# Patient Record
Sex: Male | Born: 1985 | Race: Black or African American | Hispanic: No | Marital: Single | Smoking: Never smoker
Health system: Southern US, Community
[De-identification: ages and names within clinical notes are randomized; demographics above are authoritative.]

## PROBLEM LIST (undated history)

## (undated) DIAGNOSIS — J019 Acute sinusitis, unspecified: Secondary | ICD-10-CM

## (undated) DIAGNOSIS — R51 Headache: Secondary | ICD-10-CM

## (undated) DIAGNOSIS — D649 Anemia, unspecified: Secondary | ICD-10-CM

## (undated) DIAGNOSIS — J029 Acute pharyngitis, unspecified: Secondary | ICD-10-CM

## (undated) DIAGNOSIS — J189 Pneumonia, unspecified organism: Secondary | ICD-10-CM

## (undated) DIAGNOSIS — R011 Cardiac murmur, unspecified: Secondary | ICD-10-CM

## (undated) DIAGNOSIS — R21 Rash and other nonspecific skin eruption: Secondary | ICD-10-CM

## (undated) HISTORY — DX: Rash and other nonspecific skin eruption: R21

## (undated) HISTORY — DX: Headache: R51

## (undated) HISTORY — DX: Acute pharyngitis, unspecified: J02.9

## (undated) HISTORY — PX: WISDOM TOOTH EXTRACTION: SHX21

## (undated) HISTORY — DX: Pneumonia, unspecified organism: J18.9

## (undated) HISTORY — DX: Acute sinusitis, unspecified: J01.90

---
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
## 2011-05-25 ENCOUNTER — Emergency Department (HOSPITAL_COMMUNITY)
Admission: EM | Admit: 2011-05-25 | Discharge: 2011-05-26 | Disposition: A | Discharge status: Home / Self Care | Payer: 59 | Attending: Emergency Medicine | Admitting: Emergency Medicine

## 2011-05-25 DIAGNOSIS — R000 Tachycardia, unspecified: Secondary | ICD-10-CM | POA: Insufficient documentation

## 2011-05-25 DIAGNOSIS — J189 Pneumonia, unspecified organism: Secondary | ICD-10-CM | POA: Insufficient documentation

## 2011-05-25 DIAGNOSIS — R0682 Tachypnea, not elsewhere classified: Secondary | ICD-10-CM | POA: Insufficient documentation

## 2011-05-25 DIAGNOSIS — R059 Cough, unspecified: Secondary | ICD-10-CM | POA: Insufficient documentation

## 2011-05-25 DIAGNOSIS — R112 Nausea with vomiting, unspecified: Secondary | ICD-10-CM | POA: Insufficient documentation

## 2011-05-25 DIAGNOSIS — R509 Fever, unspecified: Secondary | ICD-10-CM | POA: Insufficient documentation

## 2011-05-25 DIAGNOSIS — R05 Cough: Secondary | ICD-10-CM | POA: Insufficient documentation

## 2011-05-26 ENCOUNTER — Emergency Department (HOSPITAL_COMMUNITY): Payer: 59

## 2011-05-26 LAB — CBC
HCT: 42.3 % (ref 39.0–52.0)
Hemoglobin: 14.6 g/dL (ref 13.0–17.0)
MCH: 30.2 pg (ref 26.0–34.0)
MCHC: 34.5 g/dL (ref 30.0–36.0)
MCV: 87.6 fL (ref 78.0–100.0)
Platelets: 132 10*3/uL — ABNORMAL LOW (ref 150–400)
RBC: 4.83 MIL/uL (ref 4.22–5.81)
RDW: 12.1 % (ref 11.5–15.5)
WBC: 5.9 10*3/uL (ref 4.0–10.5)

## 2011-05-26 LAB — POCT I-STAT, CHEM 8
BUN: 14 mg/dL (ref 6–23)
Calcium, Ion: 1.13 mmol/L (ref 1.12–1.32)
Chloride: 103 mEq/L (ref 96–112)
Creatinine, Ser: 1.2 mg/dL (ref 0.50–1.35)
Glucose, Bld: 89 mg/dL (ref 70–99)
HCT: 43 % (ref 39.0–52.0)
Hemoglobin: 14.6 g/dL (ref 13.0–17.0)
Potassium: 3.5 mEq/L (ref 3.5–5.1)
Sodium: 136 mEq/L (ref 135–145)
TCO2: 23 mmol/L (ref 0–100)

## 2011-05-26 LAB — DIFFERENTIAL
Basophils Absolute: 0 10*3/uL (ref 0.0–0.1)
Basophils Relative: 0 % (ref 0–1)
Eosinophils Absolute: 0 10*3/uL (ref 0.0–0.7)
Eosinophils Relative: 1 % (ref 0–5)
Lymphocytes Relative: 34 % (ref 12–46)
Lymphs Abs: 2 10*3/uL (ref 0.7–4.0)
Monocytes Absolute: 1.3 10*3/uL — ABNORMAL HIGH (ref 0.1–1.0)
Monocytes Relative: 21 % — ABNORMAL HIGH (ref 3–12)
Neutro Abs: 2.6 10*3/uL (ref 1.7–7.7)
Neutrophils Relative %: 44 % (ref 43–77)

## 2011-05-26 LAB — RAPID HIV SCREEN (WH-MAU): Rapid HIV Screen: NONREACTIVE

## 2011-05-28 ENCOUNTER — Emergency Department (HOSPITAL_COMMUNITY)
Admission: EM | Admit: 2011-05-28 | Discharge: 2011-05-28 | Disposition: A | Discharge status: Home / Self Care | Payer: 59 | Attending: Emergency Medicine | Admitting: Emergency Medicine

## 2011-05-28 ENCOUNTER — Emergency Department (HOSPITAL_COMMUNITY): Payer: 59

## 2011-05-28 DIAGNOSIS — J189 Pneumonia, unspecified organism: Secondary | ICD-10-CM | POA: Insufficient documentation

## 2011-05-28 DIAGNOSIS — R112 Nausea with vomiting, unspecified: Secondary | ICD-10-CM | POA: Insufficient documentation

## 2011-05-28 DIAGNOSIS — Z79899 Other long term (current) drug therapy: Secondary | ICD-10-CM | POA: Insufficient documentation

## 2011-05-28 LAB — BASIC METABOLIC PANEL
CO2: 27 mEq/L (ref 19–32)
Calcium: 8.9 mg/dL (ref 8.4–10.5)
Chloride: 101 mEq/L (ref 96–112)
Creatinine, Ser: 0.9 mg/dL (ref 0.50–1.35)
Glucose, Bld: 89 mg/dL (ref 70–99)
Sodium: 136 mEq/L (ref 135–145)

## 2011-05-28 LAB — DIFFERENTIAL
Lymphs Abs: 1.4 10*3/uL (ref 0.7–4.0)
Monocytes Relative: 22 % — ABNORMAL HIGH (ref 3–12)
Neutro Abs: 1.4 10*3/uL — ABNORMAL LOW (ref 1.7–7.7)
Neutrophils Relative %: 37 % — ABNORMAL LOW (ref 43–77)

## 2011-05-28 LAB — CBC
Hemoglobin: 13.5 g/dL (ref 13.0–17.0)
MCH: 29.5 pg (ref 26.0–34.0)
MCV: 86 fL (ref 78.0–100.0)
Platelets: 163 10*3/uL (ref 150–400)
RBC: 4.58 MIL/uL (ref 4.22–5.81)
WBC: 3.8 10*3/uL — ABNORMAL LOW (ref 4.0–10.5)

## 2014-05-02 ENCOUNTER — Emergency Department (HOSPITAL_COMMUNITY)
Admission: EM | Admit: 2014-05-02 | Discharge: 2014-05-02 | Disposition: A | Discharge status: Home / Self Care | Payer: No Typology Code available for payment source | Attending: Emergency Medicine | Admitting: Emergency Medicine

## 2014-05-02 ENCOUNTER — Emergency Department (HOSPITAL_COMMUNITY): Payer: No Typology Code available for payment source

## 2014-05-02 ENCOUNTER — Encounter (HOSPITAL_COMMUNITY): Payer: Self-pay | Admitting: Emergency Medicine

## 2014-05-02 DIAGNOSIS — W268XXA Contact with other sharp object(s), not elsewhere classified, initial encounter: Secondary | ICD-10-CM | POA: Diagnosis not present

## 2014-05-02 DIAGNOSIS — Z8709 Personal history of other diseases of the respiratory system: Secondary | ICD-10-CM | POA: Insufficient documentation

## 2014-05-02 DIAGNOSIS — IMO0002 Reserved for concepts with insufficient information to code with codable children: Secondary | ICD-10-CM | POA: Insufficient documentation

## 2014-05-02 DIAGNOSIS — S61219A Laceration without foreign body of unspecified finger without damage to nail, initial encounter: Secondary | ICD-10-CM

## 2014-05-02 DIAGNOSIS — Z880 Allergy status to penicillin: Secondary | ICD-10-CM | POA: Diagnosis not present

## 2014-05-02 DIAGNOSIS — Y9289 Other specified places as the place of occurrence of the external cause: Secondary | ICD-10-CM | POA: Insufficient documentation

## 2014-05-02 DIAGNOSIS — Y9389 Activity, other specified: Secondary | ICD-10-CM | POA: Diagnosis not present

## 2014-05-02 DIAGNOSIS — S61209A Unspecified open wound of unspecified finger without damage to nail, initial encounter: Secondary | ICD-10-CM | POA: Insufficient documentation

## 2014-05-02 DIAGNOSIS — Z862 Personal history of diseases of the blood and blood-forming organs and certain disorders involving the immune mechanism: Secondary | ICD-10-CM | POA: Diagnosis not present

## 2014-05-02 DIAGNOSIS — S6990XA Unspecified injury of unspecified wrist, hand and finger(s), initial encounter: Secondary | ICD-10-CM | POA: Diagnosis present

## 2014-05-02 DIAGNOSIS — M79641 Pain in right hand: Secondary | ICD-10-CM

## 2014-05-02 DIAGNOSIS — Z8701 Personal history of pneumonia (recurrent): Secondary | ICD-10-CM | POA: Insufficient documentation

## 2014-05-02 DIAGNOSIS — Z791 Long term (current) use of non-steroidal anti-inflammatories (NSAID): Secondary | ICD-10-CM | POA: Insufficient documentation

## 2014-05-02 DIAGNOSIS — R011 Cardiac murmur, unspecified: Secondary | ICD-10-CM | POA: Insufficient documentation

## 2014-05-02 HISTORY — DX: Anemia, unspecified: D64.9

## 2014-05-02 HISTORY — DX: Cardiac murmur, unspecified: R01.1

## 2014-05-02 MED ORDER — LIDOCAINE HCL (PF) 1 % IJ SOLN: 30.0000 mL | Freq: Once | INTRAMUSCULAR | Status: DC

## 2014-05-02 MED ORDER — LIDOCAINE HCL 2 % IJ SOLN
10.0000 mL | Freq: Once | INTRAMUSCULAR | Status: AC
  Administered 2014-05-02: 200 mg
  Filled 2014-05-02: qty 20

## 2014-05-02 MED ORDER — BACITRACIN ZINC 500 UNIT/GM EX OINT
1.0000 "application " | TOPICAL_OINTMENT | Freq: Two times a day (BID) | CUTANEOUS | Status: DC
  Administered 2014-05-02: 1 via TOPICAL
  Filled 2014-05-02: qty 0.9

## 2014-05-02 MED ORDER — BACITRACIN ZINC 500 UNIT/GM EX OINT: 1.0000 "application " | TOPICAL_OINTMENT | Freq: Two times a day (BID) | CUTANEOUS | Status: DC

## 2014-05-02 MED ORDER — IBUPROFEN 600 MG PO TABS
600.0000 mg | ORAL_TABLET | Freq: Four times a day (QID) | ORAL | Status: DC | PRN
Start: 2014-05-02 — End: 2015-09-04

## 2014-05-02 NOTE — Discharge Instructions (Signed)
Apply bacitracin twice a day to prevent scarring. Take ibuprofen for pain control. Return for suture removal in 12-14 days. Return sooner if signs of infection, such as pus drainage, redness with extreme heat to touch, or fever develop.  Laceration Care, Adult A laceration is a cut or lesion that goes through all layers of the skin and into the tissue just beneath the skin. TREATMENT  Some lacerations may not require closure. Some lacerations may not be able to be closed due to an increased risk of infection. It is important to see your caregiver as soon as possible after an injury to minimize the risk of infection and maximize the opportunity for successful closure. If closure is appropriate, pain medicines may be given, if needed. The wound will be cleaned to help prevent infection. Your caregiver will use stitches (sutures), staples, wound glue (adhesive), or skin adhesive strips to repair the laceration. These tools bring the skin edges together to allow for faster healing and a better cosmetic outcome. However, all wounds will heal with a scar. Once the wound has healed, scarring can be minimized by covering the wound with sunscreen during the day for 1 full year. HOME CARE INSTRUCTIONS  For sutures or staples:  Keep the wound clean and dry.  If you were given a bandage (dressing), you should change it at least once a day. Also, change the dressing if it becomes wet or dirty, or as directed by your caregiver.  Wash the wound with soap and water 2 times a day. Rinse the wound off with water to remove all soap. Pat the wound dry with a clean towel.  After cleaning, apply a thin layer of the antibiotic ointment as recommended by your caregiver. This will help prevent infection and keep the dressing from sticking.  You may shower as usual after the first 24 hours. Do not soak the wound in water until the sutures are removed.  Only take over-the-counter or prescription medicines for pain,  discomfort, or fever as directed by your caregiver.  Get your sutures or staples removed as directed by your caregiver. For skin adhesive strips:  Keep the wound clean and dry.  Do not get the skin adhesive strips wet. You may bathe carefully, using caution to keep the wound dry.  If the wound gets wet, pat it dry with a clean towel.  Skin adhesive strips will fall off on their own. You may trim the strips as the wound heals. Do not remove skin adhesive strips that are still stuck to the wound. They will fall off in time. For wound adhesive:  You may briefly wet your wound in the shower or bath. Do not soak or scrub the wound. Do not swim. Avoid periods of heavy perspiration until the skin adhesive has fallen off on its own. After showering or bathing, gently pat the wound dry with a clean towel.  Do not apply liquid medicine, cream medicine, or ointment medicine to your wound while the skin adhesive is in place. This may loosen the film before your wound is healed.  If a dressing is placed over the wound, be careful not to apply tape directly over the skin adhesive. This may cause the adhesive to be pulled off before the wound is healed.  Avoid prolonged exposure to sunlight or tanning lamps while the skin adhesive is in place. Exposure to ultraviolet light in the first year will darken the scar.  The skin adhesive will usually remain in place for 5 to  10 days, then naturally fall off the skin. Do not pick at the adhesive film. You may need a tetanus shot if:  You cannot remember when you had your last tetanus shot.  You have never had a tetanus shot. If you get a tetanus shot, your arm may swell, get red, and feel warm to the touch. This is common and not a problem. If you need a tetanus shot and you choose not to have one, there is a rare chance of getting tetanus. Sickness from tetanus can be serious. SEEK MEDICAL CARE IF:   You have redness, swelling, or increasing pain in the  wound.  You see a red line that goes away from the wound.  You have yellowish-white fluid (pus) coming from the wound.  You have a fever.  You notice a bad smell coming from the wound or dressing.  Your wound breaks open before or after sutures have been removed.  You notice something coming out of the wound such as wood or glass.  Your wound is on your hand or foot and you cannot move a finger or toe. SEEK IMMEDIATE MEDICAL CARE IF:   Your pain is not controlled with prescribed medicine.  You have severe swelling around the wound causing pain and numbness or a change in color in your arm, hand, leg, or foot.  Your wound splits open and starts bleeding.  You have worsening numbness, weakness, or loss of function of any joint around or beyond the wound.  You develop painful lumps near the wound or on the skin anywhere on your body. MAKE SURE YOU:   Understand these instructions.  Will watch your condition.  Will get help right away if you are not doing well or get worse. Document Released: 07/24/2005 Document Revised: 10/16/2011 Document Reviewed: 01/17/2011 [facility] Patient Information 2015 [place], [place]. This information is not intended to replace advice given to you by your health care provider. Make sure you discuss any questions you have with your health care provider.

## 2014-05-02 NOTE — ED Notes (Signed)
Pt states he punched apartment window @ 30 min ago, laceration to R hand, bandage noted from first responders.

## 2014-05-02 NOTE — ED Provider Notes (Signed)
Medical screening examination/treatment/procedure(s) were performed by non-physician practitioner and as supervising physician I was immediately available for consultation/collaboration.   EKG Interpretation None       Olivia Mackie, MD 05/02/14 [phone]

## 2014-05-02 NOTE — ED Notes (Signed)
Registration at bedside.

## 2014-05-02 NOTE — ED Notes (Signed)
Patient states he punched a glass window because he was mad. Patient denies intent to harm self. Patient reports tetanus was within prior 5 years. Patient states [employer] responded and initially wrapped his right hand. Dressing removed. Scant bleeding noted. Multiple laceration sites to right hand. Patient has 2 visitors at bedside at this time.

## 2014-05-02 NOTE — ED Provider Notes (Signed)
CSN: [id number]     Arrival date & time 05/02/14  0319 History   First MD Initiated Contact with Patient 05/02/14 [phone]     Chief Complaint  Patient presents with  . Laceration    (Consider location/radiation/quality/duration/timing/severity/associated sxs/prior Treatment) HPI Comments: 28 year old male presents to the emergency department for further evaluation of right hand injury after patient punched a window out of anger. Patient is right-hand dominant. Tetanus up-to-date. No medications given prior to arrival.  Patient is a 28 y.o. male presenting with skin laceration. The history is provided by the patient. No language interpreter was used.  Laceration Location:  Hand and finger Hand laceration location:  Dorsum of R hand Finger laceration location:  R index finger Length (cm):  2.5cm laceration to R index finger; small superficial lacerations also scattered on dorsum of R hand Depth:  Through dermis (R index) Quality: jagged   Bleeding: controlled   Time since incident: incident @ 0300. Laceration mechanism:  Broken glass (patient punched window) Pain details:    Quality:  Aching and throbbing   Severity:  Mild   Timing:  Constant   Progression:  Unchanged Foreign body present:  No foreign bodies Relieved by:  None tried Worsened by:  Pressure Tetanus status:  Up to date   Past Medical History  Diagnosis Date  . Acute pharyngitis   . Headache(784.0)   . Acute sinusitis, unspecified   . Rash and other nonspecific skin eruption   . Pneumonia   . Murmur   . Anemia    Past Surgical History  Procedure Laterality Date  . Wisdom tooth extraction     No family history on file. History  Substance Use Topics  . Smoking status: Never Smoker   . Smokeless tobacco: Not on file  . Alcohol Use: Yes     Comment: social    Review of Systems  Musculoskeletal: Positive for arthralgias.  Skin: Positive for wound.  Neurological: Negative for weakness and numbness.  All  other systems reviewed and are negative.   Allergies  Penicillins  Home Medications   Prior to Admission medications   Medication Sig Start Date End Date Taking? Authorizing Provider  ibuprofen (ADVIL,MOTRIN) 200 MG tablet Take 400 mg by mouth once.   Yes Historical Provider, MD   BP 115/64  Pulse 103  Temp(Src) 98.4 F (36.9 C) (Oral)  Resp 18  SpO2 99%  Physical Exam  Nursing note and vitals reviewed. Constitutional: He is oriented to person, place, and time. He appears well-developed and well-nourished. No distress.  Nontoxic/nonseptic appearing  HENT:  Head: Normocephalic and atraumatic.  Eyes: Conjunctivae and EOM are normal. No scleral icterus.  Neck: Normal range of motion.  Cardiovascular: Normal rate, regular rhythm and intact distal pulses.   Distal radial pulse 2+ in right upper extremity. Capillary refill normal in all digits of right hand  Pulmonary/Chest: Effort normal. No respiratory distress.  Chest expansion symmetric  Musculoskeletal: Normal range of motion.       Right hand: He exhibits tenderness and laceration. He exhibits normal range of motion, no bony tenderness, normal capillary refill, no deformity and no swelling. Normal sensation noted. Normal strength noted.       Hands: Neurological: He is alert and oriented to person, place, and time. He exhibits normal muscle tone. Coordination normal.  No gross sensory deficits appreciated. Patient able to wiggle all fingers of right hand.  Skin: Skin is warm and dry. No rash noted. He is not diaphoretic. No erythema.  No pallor.  Psychiatric: He has a normal mood and affect. His behavior is normal.    ED Course  Procedures (including critical care time) Labs Review Labs Reviewed - No data to display  Imaging Review Dg Hand Complete Right  05/02/2014   CLINICAL DATA:  Pain in the hand and lacerations over the knuckles after punching a window.  EXAM: RIGHT HAND - COMPLETE 3+ VIEW  COMPARISON:  None.   FINDINGS: There is no evidence of fracture or dislocation. There is no evidence of arthropathy or other focal bone abnormality. Soft tissues are unremarkable.  IMPRESSION: Negative.   Electronically Signed   By: Burman Nieves M.D.   On: 05/02/2014 04:19     EKG Interpretation None      LACERATION REPAIR Performed by: Antony Madura Authorized by: Antony Madura Consent: Verbal consent obtained. Risks and benefits: risks, benefits and alternatives were discussed Consent given by: patient Patient identity confirmed: provided demographic data Prepped and Draped in normal sterile fashion Wound explored  Laceration Location: R index finger  Laceration Length: 2.5cm  No Foreign Bodies seen or palpated  Anesthesia: local infiltration  Local anesthetic: lidocaine 2% without epinephrine  Anesthetic total: 1 ml  Irrigation method: syringe Amount of cleaning: standard  Skin closure: 5-0 ethilon  Number of sutures: 6  Technique: simple interrupted  Patient tolerance: Patient tolerated the procedure well with no immediate complications.  MDM   Final diagnoses:  Right hand pain  Finger laceration, initial encounter    28 year old male presents to the emergency department for further evaluation of right hand pain after he punched a window out of anger. Patient with lacerations, superficially, to dorsum of right hand as well as one 2.5 cm laceration to right index finger requiring suturing. Patient neurovascularly intact in physical exam. He exhibits normal range of motion. Imaging today shows no evidence of residual glass fragments. None visualize when exploring the wound.   Tdap booster UTD. Laceration occurred < 8 hours prior to repair which was well tolerated. Pt has no comorbidities to effect normal wound healing. Discussed suture home care with patient and answered questions. Pt to follow up for wound check and suture removal in 12-14 days. Patient is hemodynamically stable  with no complaints prior to discharge. Return precautions provided and patient agreeable to plan with no unaddressed concerns.   Filed Vitals:   05/02/14 0324  BP: 115/64  Pulse: 103  Temp: 98.4 F (36.9 C)  TempSrc: Oral  Resp: 18  SpO2: 99%          Antony Madura, PA-C 05/02/14 [phone]

## 2014-05-17 ENCOUNTER — Encounter (HOSPITAL_COMMUNITY): Payer: Self-pay | Admitting: Emergency Medicine

## 2014-05-17 ENCOUNTER — Emergency Department (HOSPITAL_COMMUNITY)
Admission: EM | Admit: 2014-05-17 | Discharge: 2014-05-17 | Disposition: A | Discharge status: Home / Self Care | Payer: No Typology Code available for payment source | Attending: Emergency Medicine | Admitting: Emergency Medicine

## 2014-05-17 DIAGNOSIS — Z8709 Personal history of other diseases of the respiratory system: Secondary | ICD-10-CM | POA: Diagnosis not present

## 2014-05-17 DIAGNOSIS — R011 Cardiac murmur, unspecified: Secondary | ICD-10-CM | POA: Diagnosis not present

## 2014-05-17 DIAGNOSIS — Z8701 Personal history of pneumonia (recurrent): Secondary | ICD-10-CM | POA: Diagnosis not present

## 2014-05-17 DIAGNOSIS — Z862 Personal history of diseases of the blood and blood-forming organs and certain disorders involving the immune mechanism: Secondary | ICD-10-CM | POA: Diagnosis not present

## 2014-05-17 DIAGNOSIS — Z4802 Encounter for removal of sutures: Secondary | ICD-10-CM | POA: Insufficient documentation

## 2014-05-17 DIAGNOSIS — Z792 Long term (current) use of antibiotics: Secondary | ICD-10-CM | POA: Diagnosis not present

## 2014-05-17 DIAGNOSIS — Z791 Long term (current) use of non-steroidal anti-inflammatories (NSAID): Secondary | ICD-10-CM | POA: Insufficient documentation

## 2014-05-17 DIAGNOSIS — Z880 Allergy status to penicillin: Secondary | ICD-10-CM | POA: Insufficient documentation

## 2014-05-17 NOTE — ED Provider Notes (Signed)
CSN: [id number]     Arrival date & time 05/17/14  [id number] History   First MD Initiated Contact with Patient 05/17/14 [phone]     Chief Complaint  Patient presents with  . Suture / Staple Removal     (Consider location/radiation/quality/duration/timing/severity/associated sxs/prior Treatment) HPI Jordan Everett is a 28 year old male presenting to the ER today for suture removal of 6 sutures. Patient was seen and evaluated on 05/02/14 for laceration of his finger. Patient underwent laceration repair of right index finger at that time was 6 sutures. Patient denies any swelling, erythema, drainage, loss of range of motion, weakness, numbness to the affected area. Patient denies fever, nausea, vomiting, shortness of breath. Past Medical History  Diagnosis Date  . Acute pharyngitis   . Headache(784.0)   . Acute sinusitis, unspecified   . Rash and other nonspecific skin eruption   . Pneumonia   . Murmur   . Anemia    Past Surgical History  Procedure Laterality Date  . Wisdom tooth extraction     History reviewed. No pertinent family history. History  Substance Use Topics  . Smoking status: Never Smoker   . Smokeless tobacco: Not on file  . Alcohol Use: Yes     Comment: social    Review of Systems  Skin: Positive for wound. Negative for color change.  Neurological: Negative for weakness and numbness.      Allergies  Penicillins  Home Medications   Prior to Admission medications   Medication Sig Start Date End Date Taking? Authorizing Provider  bacitracin ointment Apply 1 application topically 2 (two) times daily. 05/02/14  Yes Antony MaduraKelly Humes, PA-C  ibuprofen (ADVIL,MOTRIN) 200 MG tablet Take 400 mg by mouth once.   Yes Historical Provider, MD  ibuprofen (ADVIL,MOTRIN) 600 MG tablet Take 1 tablet (600 mg total) by mouth every 6 (six) hours as needed. 05/02/14  Yes Kelly Humes, PA-C   BP 115/69  Pulse 64  Temp(Src) 97.8 F (36.6 C) (Oral)  Resp 16  SpO2 100% Physical Exam  Nursing  note and vitals reviewed. Constitutional: He appears well-developed and well-nourished. No distress.  HENT:  Head: Normocephalic and atraumatic.  Eyes: Conjunctivae are normal. Right eye exhibits no discharge. Left eye exhibits no discharge. No scleral icterus.  Cardiovascular:  Peripheral pulses intact at injured extremity.   Pulmonary/Chest: Effort normal. No respiratory distress.  Neurological: He is alert.  No numbness distal to injury.    Skin: Skin is warm and dry. No rash noted. He is not diaphoretic.  Well-healed nonerythematous wound noted to right index finger. 6 [address]. the sutures noted to patient's wound. No erythema, edema, discharge,  signs of dehiscence or infection.     ED Course  Procedures (including critical care time) Labs Review Labs Reviewed - No data to display  Imaging Review No results found.   EKG Interpretation None      MDM   Final diagnoses:  Visit for suture removal    Staple removal   Pt to ER for staple/suture removal and wound check as above. Procedure tolerated well. Vitals normal, no signs of infection. Scar minimization & return precautions given at dc.   BP 115/69  Pulse 64  Temp(Src) 97.8 F (36.6 C) (Oral)  Resp 16  SpO2 100%  Signed,  Jordan MowJoe Ninetta Adelstein, PA-C 11:00 AM      Jordan FantasiaJoseph W Jamillah Camilo, PA-C 05/17/14 1100

## 2014-05-17 NOTE — ED Provider Notes (Signed)
Medical screening examination/treatment/procedure(s) were performed by non-physician practitioner and as supervising physician I was immediately available for consultation/collaboration.   EKG Interpretation None       Tarah Buboltz R. Dayne Chait, MD 05/17/14 1535 

## 2014-05-17 NOTE — Discharge Instructions (Signed)

## 2014-05-17 NOTE — ED Notes (Signed)
Here for suture removal from rt index finger.

## 2015-08-17 ENCOUNTER — Ambulatory Visit
Admission: RE | Admit: 2015-08-17 | Discharge: 2015-08-17 | Disposition: A | Discharge status: Home / Self Care | Payer: No Typology Code available for payment source | Source: Ambulatory Visit | Attending: Family Medicine | Admitting: Family Medicine

## 2015-08-17 ENCOUNTER — Other Ambulatory Visit: Payer: Self-pay | Admitting: Family Medicine

## 2015-08-17 DIAGNOSIS — M542 Cervicalgia: Secondary | ICD-10-CM

## 2015-09-04 ENCOUNTER — Encounter (HOSPITAL_COMMUNITY): Payer: Self-pay

## 2015-09-04 ENCOUNTER — Emergency Department (HOSPITAL_COMMUNITY): Payer: No Typology Code available for payment source

## 2015-09-04 ENCOUNTER — Emergency Department (HOSPITAL_COMMUNITY)
Admission: EM | Admit: 2015-09-04 | Discharge: 2015-09-04 | Disposition: A | Discharge status: Home / Self Care | Payer: No Typology Code available for payment source | Attending: Emergency Medicine | Admitting: Emergency Medicine

## 2015-09-04 DIAGNOSIS — F32A Depression, unspecified: Secondary | ICD-10-CM

## 2015-09-04 DIAGNOSIS — F329 Major depressive disorder, single episode, unspecified: Secondary | ICD-10-CM | POA: Insufficient documentation

## 2015-09-04 DIAGNOSIS — Z008 Encounter for other general examination: Secondary | ICD-10-CM | POA: Insufficient documentation

## 2015-09-04 DIAGNOSIS — Z915 Personal history of self-harm: Secondary | ICD-10-CM | POA: Insufficient documentation

## 2015-09-04 DIAGNOSIS — Z79899 Other long term (current) drug therapy: Secondary | ICD-10-CM | POA: Insufficient documentation

## 2015-09-04 DIAGNOSIS — Z8709 Personal history of other diseases of the respiratory system: Secondary | ICD-10-CM | POA: Insufficient documentation

## 2015-09-04 DIAGNOSIS — Z8701 Personal history of pneumonia (recurrent): Secondary | ICD-10-CM | POA: Insufficient documentation

## 2015-09-04 DIAGNOSIS — Z880 Allergy status to penicillin: Secondary | ICD-10-CM | POA: Insufficient documentation

## 2015-09-04 DIAGNOSIS — R011 Cardiac murmur, unspecified: Secondary | ICD-10-CM | POA: Insufficient documentation

## 2015-09-04 DIAGNOSIS — R079 Chest pain, unspecified: Secondary | ICD-10-CM | POA: Insufficient documentation

## 2015-09-04 DIAGNOSIS — Z862 Personal history of diseases of the blood and blood-forming organs and certain disorders involving the immune mechanism: Secondary | ICD-10-CM | POA: Insufficient documentation

## 2015-09-04 DIAGNOSIS — R45851 Suicidal ideations: Secondary | ICD-10-CM

## 2015-09-04 LAB — CBC WITH DIFFERENTIAL/PLATELET
Basophils Absolute: 0 10*3/uL (ref 0.0–0.1)
Basophils Relative: 0 %
Eosinophils Absolute: 0 10*3/uL (ref 0.0–0.7)
Eosinophils Relative: 0 %
HCT: 40.1 % (ref 39.0–52.0)
HEMOGLOBIN: 13.2 g/dL (ref 13.0–17.0)
LYMPHS ABS: 2.4 10*3/uL (ref 0.7–4.0)
LYMPHS PCT: 53 %
MCH: 29.9 pg (ref 26.0–34.0)
MCHC: 32.9 g/dL (ref 30.0–36.0)
MCV: 90.7 fL (ref 78.0–100.0)
MONO ABS: 0.6 10*3/uL (ref 0.1–1.0)
MONOS PCT: 13 %
NEUTROS ABS: 1.5 10*3/uL — AB (ref 1.7–7.7)
Neutrophils Relative %: 34 %
PLATELETS: 180 10*3/uL (ref 150–400)
RBC: 4.42 MIL/uL (ref 4.22–5.81)
RDW: 12.5 % (ref 11.5–15.5)
WBC: 4.5 10*3/uL (ref 4.0–10.5)

## 2015-09-04 LAB — BASIC METABOLIC PANEL
Anion gap: 7 (ref 5–15)
BUN: 13 mg/dL (ref 6–20)
CHLORIDE: 106 mmol/L (ref 101–111)
CO2: 25 mmol/L (ref 22–32)
Calcium: 8.8 mg/dL — ABNORMAL LOW (ref 8.9–10.3)
Creatinine, Ser: 0.9 mg/dL (ref 0.61–1.24)
Glucose, Bld: 103 mg/dL — ABNORMAL HIGH (ref 65–99)
POTASSIUM: 3.8 mmol/L (ref 3.5–5.1)
SODIUM: 138 mmol/L (ref 135–145)

## 2015-09-04 LAB — ETHANOL

## 2015-09-04 LAB — SALICYLATE LEVEL

## 2015-09-04 LAB — ACETAMINOPHEN LEVEL: Acetaminophen (Tylenol), Serum: <10 ug/mL — ABNORMAL LOW (ref 10–30)

## 2015-09-04 LAB — I-STAT TROPONIN, ED: TROPONIN I, POC: 0.01 ng/mL (ref 0.00–0.08)

## 2015-09-04 MED ORDER — LORAZEPAM 1 MG PO TABS: 1.0000 mg | ORAL_TABLET | Freq: Three times a day (TID) | ORAL | Status: DC | PRN

## 2015-09-04 MED ORDER — ACETAMINOPHEN 325 MG PO TABS
650.0000 mg | ORAL_TABLET | Freq: Once | ORAL | Status: AC
  Administered 2015-09-04: 650 mg via ORAL
  Filled 2015-09-04: qty 2

## 2015-09-04 MED ORDER — ACETAMINOPHEN 325 MG PO TABS: 650.0000 mg | ORAL_TABLET | ORAL | Status: DC | PRN

## 2015-09-04 MED ORDER — IBUPROFEN 200 MG PO TABS: 600.0000 mg | ORAL_TABLET | Freq: Three times a day (TID) | ORAL | Status: DC | PRN

## 2015-09-04 NOTE — ED Notes (Signed)
Per EMS- Patient texted his girlfriend today and stated that he had cut both of his arms and blood was everywhere. Patient did not cut himself. Patient states he has had depression for a couple of years. Patient also c/o CP and states this occurs when he is stressed. Patient has had no counseling. EKG OK. Patient is withdrawn and quiet.

## 2015-09-04 NOTE — ED Notes (Signed)
Patient states SI, with plan to cut himself. Patient states he hears voices that tell him that he is not worthy of anything. Patient denies HI and visual hallucinations.

## 2015-09-04 NOTE — BH Assessment (Addendum)
Assessment Note  Jordan Everett is a 30 year old male presenting with suicidal thoughts after he spoke to his ex-girlfriend on the telephone today.   Patient reports that, "they broke up" 2-3 weeks ago".  Patient reports that they were together for two years before they broke up.  Patient now denies any suicidal plan to harm himself.  Patient reports that he only said that so that his, "ex-girlfriend would feel sorry for him and then agree to come back to him".   Patient reports increased feelings of anxiety because, "she's already moved on and found another guy". Patient reports a history of self-injury behavior to include punching walls or slam his head into walls when he is upset.  Patient reports that he is not doing these things in an attempt to kill himself he is engaging in these self-injurious behaviors due to overwhelming feelings of anger and frustration and he does not know how to hand these emotions.   Patient denies hearing voices that tell him that he is not worthy of anything.  Patient denies HI and visual hallucinations.  Patient denies substance abuse.  Patient BAL is <5.  Patient now denies occasionally hearing commands voices telling him to hurt other people.  Patient reports that his feelings are hurt; however, "I would hurt his ex-girlfriend's or her new boyfriend".   Patient reports that he resides with his mother and father.  Patient reports that his parents are very supportive.  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  Patient reports that he has been working there for over a month.    Patient denies prior psychiatric hospitalizations.  Patient denies prior mental health outpatient medication management or therapy.  Patient denies physical, sexual or emotional abuse. Patient denies any upcoming court dates or having access to any weapons.    Per Leonette Most, NP - patient does not meet criteria for inpatient hospitalization.  Writer obtained  collateral information from the patient parent who reports that the patient was not trying to harm himself.  Patient does not have a psychiatric history.  The family reports that they feel safe with the patient returning home.  Writer gave referral information for the patient to follow up with [facility] intensive outpatient program. Writer agreed to go to therapy for his feeling of depression related to the break up from his girlfriend.   Writer informed the ER MD of the disposition.    Diagnosis: Major Depressive Disorder   Past Medical History:  Past Medical History  Diagnosis Date  . Acute pharyngitis   . Headache(784.0)   . Acute sinusitis, unspecified   . Rash and other nonspecific skin eruption   . Pneumonia   . Murmur   . Anemia     Past Surgical History  Procedure Laterality Date  . Wisdom tooth extraction      Family History: History reviewed. No pertinent family history.  Social History:  reports that he has never smoked. He has never used smokeless tobacco. He reports that he drinks alcohol. He reports that he does not use illicit drugs.  Additional Social History:  Alcohol / Drug Use History of alcohol / drug use?: No history of alcohol / drug abuse  CIWA: CIWA-Ar BP: 109/79 mmHg Pulse Rate: 77 COWS:    Allergies:  Allergies  Allergen Reactions  . Penicillins Hives    Has patient had a PCN reaction causing immediate rash, facial/tongue/throat swelling, SOB or lightheadedness with hypotension: No Has patient  had a PCN reaction causing severe rash involving mucus membranes or skin necrosis: Yes Has patient had a PCN reaction that required hospitalization No Has patient had a PCN reaction occurring within the last 10 years: No If all of the above answers are "NO", then may proceed with Cephalosporin use.     Home Medications:  (Not in a hospital admission)  OB/GYN Status:  No LMP for male patient.  General Assessment Data Location of Assessment: [facility]  ED TTS Assessment: In system Is this a Tele or Face-to-Face Assessment?: Face-to-Face Is this an Initial Assessment or a Re-assessment for this encounter?: Initial Assessment Marital status: Single Maiden name: NA Is patient pregnant?: No Pregnancy Status: No Living Arrangements: Parent Delice Bison and Terek Bee [phone] and [phone]) Can pt return to current living arrangement?: Yes Admission Status: Voluntary Is patient capable of signing voluntary admission?: Yes Referral Source: Self/Family/Friend Insurance type: None Reported  Medical Screening Exam [facility] Walk-in ONLY) Medical Exam completed: Yes  Crisis Care Plan Living Arrangements: Parent Delice Bison and Aleksi Brummet [phone] and [phone]) Legal Guardian:  (NA) Name of Psychiatrist: None Reported Name of Therapist: None Reported  Education Status Is patient currently in school?: No Current Grade: NA Highest grade of school patient has completed: 12 Name of school: NA Contact person: NA  Risk to self with the past 6 months Suicidal Ideation: Yes-Currently Present Has patient been a risk to self within the past 6 months prior to admission? : No Suicidal Intent: Yes-Currently Present Has patient had any suicidal intent within the past 6 months prior to admission? : No Is patient at risk for suicide?: No Suicidal Plan?: Yes-Currently Present Has patient had any suicidal plan within the past 6 months prior to admission? : No Specify Current Suicidal Plan: Told his ex-girlfriend that the would cut his wrist  (Patient now denies wanting to harm himself. ) Access to Means: Yes Specify Access to Suicidal Means: Knife What has been your use of drugs/alcohol within the last 12 months?: None Reported Previous Attempts/Gestures: No How many times?: 0 Other Self Harm Risks: None Reported Triggers for Past Attempts: None known Intentional Self Injurious Behavior: Bruising (Punching walls when he is angry ) Comment -  Self Injurious Behavior: Punching walls when he is aNGRY Family Suicide History: No Recent stressful life event(s): Conflict (Comment) (Broke up with his girlfriend) Persecutory voices/beliefs?: No Depression: Yes Depression Symptoms: Insomnia, Tearfulness, Fatigue, Loss of interest in usual pleasures, Feeling worthless/self pity, Feeling angry/irritable Substance abuse history and/or treatment for substance abuse?: No Suicide prevention information given to non-admitted patients: Yes  Risk to Others within the past 6 months Homicidal Ideation: No Does patient have any lifetime risk of violence toward others beyond the six months prior to admission? : No Thoughts of Harm to Others: No Current Homicidal Intent: No Current Homicidal Plan: No Access to Homicidal Means: No Identified Victim: None Reported History of harm to others?: No Assessment of Violence: None Noted Violent Behavior Description: None Reported Does patient have access to weapons?: No Criminal Charges Pending?: No Does patient have a court date: No Is patient on probation?: No  Psychosis Hallucinations: None noted Delusions: None noted  Mental Status Report Appearance/Hygiene: Disheveled Eye Contact: Poor Motor Activity: Freedom of movement, Restlessness Speech: Logical/coherent Level of Consciousness: Alert Mood: Depressed Affect: Depressed, Blunted Anxiety Level: None Thought Processes: Coherent, Relevant Judgement: Unimpaired Orientation: Place, Person, Time, Situation Obsessive Compulsive Thoughts/Behaviors: None  Cognitive Functioning Concentration: Decreased Memory: Recent Intact, Remote Intact IQ: Average Insight: Fair  Impulse Control: Poor Appetite: Fair Weight Loss: 0 Weight Gain: 0 Sleep: Decreased Total Hours of Sleep: 6 Vegetative Symptoms: Decreased grooming, Staying in bed  ADLScreening [facility]) Patient's cognitive ability adequate to safely complete daily  activities?: Yes Patient able to express need for assistance with ADLs?: Yes Independently performs ADLs?: Yes (appropriate for developmental age)  Prior Inpatient Therapy Prior Inpatient Therapy: No Prior Therapy Dates: NA Prior Therapy Facilty/Provider(s): NA Reason for Treatment: NA  Prior Outpatient Therapy Prior Outpatient Therapy: No Prior Therapy Dates: NA Prior Therapy Facilty/Provider(s): NA Reason for Treatment: NA Does patient have an ACCT team?: No Does patient have Intensive In-House Services?  : No Does patient have [facility] services? : No Does patient have P4CC services?: No  ADL Screening (condition at time of admission) Patient's cognitive ability adequate to safely complete daily activities?: Yes Is the patient deaf or have difficulty hearing?: No Does the patient have difficulty seeing, even when wearing glasses/contacts?: No Does the patient have difficulty concentrating, remembering, or making decisions?: No Patient able to express need for assistance with ADLs?: Yes Does the patient have difficulty dressing or bathing?: No Independently performs ADLs?: Yes (appropriate for developmental age) Does the patient have difficulty walking or climbing stairs?: No Weakness of Legs: None Weakness of Arms/Hands: None  Home Assistive Devices/Equipment Home Assistive Devices/Equipment: None    Abuse/Neglect Assessment (Assessment to be complete while patient is alone) Physical Abuse: Denies Verbal Abuse: Denies Sexual Abuse: Denies Exploitation of patient/patient's resources: Denies Self-Neglect: Denies Values / Beliefs Cultural Requests During Hospitalization: None Spiritual Requests During Hospitalization: None Consults Spiritual Care Consult Needed: No Social Work Consult Needed: No Merchant navy officer (For Healthcare) Does patient have an advance directive?: No Would patient like information on creating an advanced directive?: No - patient declined  information    Additional Information 1:1 In Past 12 Months?: No CIRT Risk: No Elopement Risk: No Does patient have medical clearance?: Yes     Disposition: Per Leonette Most, NP - patient does not meet criteria for inpatient hospitalization.  Writer obtained collateral information from the patient parent who reports that the patient was not trying to harm himself.  Patient does not have a psychiatric history.  The family reports that they feel safe with the patient returning home.  Writer gave referral information for the patient to follow up with [facility] intensive outpatient program. Writer agreed to go to therapy for his feeling of depression related to the break up from his girlfriend.   Writer informed the ER MD of the disposition.  Disposition Initial Assessment Completed for this Encounter: Yes Disposition of Patient: Other dispositions  On Site Evaluation by:   Reviewed with Physician:    Phillip Heal LaVerne 09/04/2015 7:55 PM

## 2015-09-04 NOTE — ED Provider Notes (Signed)
CSN: [id number]     Arrival date & time 09/04/15  1709 History   First MD Initiated Contact with Patient 09/04/15 1711     Chief Complaint  Patient presents with  . Medical Clearance  . Suicidal   HPI  [name] is a 30 year old male presenting with suicidal thoughts. He reports multiple years of feeling depressed but this has recently worsened. He and his girlfriend have recently broke up and he states "she's already moved on and found another guy". He states that he is now having suicidal thoughts and has a plan to slit his wrists. He had texted his ex-girlfriend earlier today that he had slit both his wrists when in fact he had not. He states that he is thinking about ending his life currently and feels that he may cut his wrists if he was at home. He reports a history of self injury. He states that he will punch walls or slam his head into walls when he is upset. He has not done this in the past month. He also notes hearing voices. He states this has been present for years as well. The voices are intermittent and show up when he is under stress. He states they tell him that he is "not good for anything" and "we knew you would mess it up". He states they occasionally command him to hurt other people. He states currently he has thoughts of wanting to hurt his ex-girlfriend's new boyfriend. He denies homicidal ideation. He has never followed with a counselor or psychiatrist for his depression. He is also complaining of left sided chest pain. He states that he always gets this chest pain when he is under stress. The pain has been present for approximately 1 hour. The pain is over the left anterior chest and described as throbbing. He states that stress exacerbates the pain and "feeling calm" alleviates it. No associated SOB. The pain is not exertional. He states it has begun to improve since he arrived at the ED. He has no other physical complaints. Denies auditory hallucinations.   Past Medical History   Diagnosis Date  . Acute pharyngitis   . Headache(784.0)   . Acute sinusitis, unspecified   . Rash and other nonspecific skin eruption   . Pneumonia   . Murmur   . Anemia    Past Surgical History  Procedure Laterality Date  . Wisdom tooth extraction     History reviewed. No pertinent family history. Social History  Substance Use Topics  . Smoking status: Never Smoker   . Smokeless tobacco: Never Used  . Alcohol Use: Yes     Comment: social    Review of Systems  Cardiovascular: Positive for chest pain.  Psychiatric/Behavioral: Positive for suicidal ideas, hallucinations, self-injury and dysphoric mood.  All other systems reviewed and are negative.     Allergies  Penicillins  Home Medications   Prior to Admission medications   Medication Sig Start Date End Date Taking? Authorizing Provider  aspirin-acetaminophen-caffeine (EXCEDRIN MIGRAINE) [phone] MG tablet Take 2 tablets by mouth every 6 (six) hours as needed for headache.   Yes Historical Provider, MD  cyclobenzaprine (FLEXERIL) 10 MG tablet Take 10 mg by mouth at bedtime as needed. 08/16/15  Yes Historical Provider, MD  ibuprofen (ADVIL,MOTRIN) 200 MG tablet Take 600-800 mg by mouth daily as needed for moderate pain.    Yes Historical Provider, MD  Multiple Vitamin (MULTIVITAMIN WITH MINERALS) TABS tablet Take 1 tablet by mouth daily.   Yes  Historical Provider, MD   BP 112/79 mmHg  Pulse 62  Temp(Src) 99 F (37.2 C) (Oral)  Resp 19  SpO2 99% Physical Exam  Constitutional: He appears well-developed and well-nourished. No distress.  HENT:  Head: Normocephalic and atraumatic.  Eyes: Conjunctivae are normal. Right eye exhibits no discharge. Left eye exhibits no discharge. No scleral icterus.  Neck: Normal range of motion.  Cardiovascular: Normal rate and regular rhythm.   Pulmonary/Chest: Effort normal and breath sounds normal. No respiratory distress. He has no wheezes. He has no rales.  Abdominal: Soft. There  is no tenderness.  Musculoskeletal: Normal range of motion.  Neurological: He is alert. Coordination normal.  Skin: Skin is warm and dry.  No wounds to the wrists  Psychiatric: His speech is delayed. He is withdrawn. Thought content is not paranoid and not delusional. He expresses impulsivity. He exhibits a depressed mood. He expresses suicidal ideation. He expresses no homicidal ideation. He expresses suicidal plans. He expresses no homicidal plans.  Speaking with a soft voice. Speech somewhat delayed. He is extremely withdrawn with a flat affect.   Nursing note and vitals reviewed.   ED Course  Procedures (including critical care time) Labs Review Labs Reviewed  CBC WITH DIFFERENTIAL/PLATELET - Abnormal; Notable for the following:    Neutro Abs 1.5 (*)    All other components within normal limits  BASIC METABOLIC PANEL - Abnormal; Notable for the following:    Glucose, Bld 103 (*)    Calcium 8.8 (*)    All other components within normal limits  ACETAMINOPHEN LEVEL - Abnormal; Notable for the following:    Acetaminophen (Tylenol), Serum <10 (*)    All other components within normal limits  SALICYLATE LEVEL  ETHANOL  URINALYSIS, ROUTINE W REFLEX MICROSCOPIC (NOT AT [facility])  URINE RAPID DRUG SCREEN, HOSP PERFORMED  I-STAT TROPOININ, ED    Imaging Review Dg Chest 2 View  09/04/2015  CLINICAL DATA:  30 year old with acute onset chest pain earlier today. EXAM: CHEST  2 VIEW COMPARISON:  05/28/2011, 05/26/2011. FINDINGS: Cardiomediastinal silhouette unremarkable. Lungs clear. Bronchovascular markings normal. Pulmonary vascularity normal. No pneumothorax. No pleural effusions. Visualized bony thorax intact. No sequela from the prior cavitary left upper lobe pneumonia identified on the prior examinations. IMPRESSION: Normal examination. Electronically Signed   By: Hulan Saas M.D.   On: 09/04/2015 18:55   I have personally reviewed and evaluated these images and lab results as part of  my medical decision-making.   EKG Interpretation   Date/Time:  Saturday September 04 2015 17:51:01 EST Ventricular Rate:  93 PR Interval:  189 QRS Duration: 77 QT Interval:  344 QTC Calculation: 428 R Axis:   67 Text Interpretation:  Sinus rhythm ST elev, probable normal early repol  pattern No significant change since last tracing Confirmed by Denton Lank  MD,  Caryn Bee ([id number]) on 09/04/2015 5:59:38 PM      MDM   Final diagnoses:  Depression  Suicidal ideations   Patient presenting with depression, SI and auditory hallucinations. Pt also complaining of chest pain. He reports history of chest pain when he is under stress. Chest pain is atypical in presentation. Unlikely to be cardiac or pulmonary. Denies all other complaints at this time. VSS and pt is non-toxic appearing. Benign physical exam and blood work is unremarkable. Troponin negative with non-ischemic EKG. Negative chest xray. Patient has been medically cleared in the ED and is appropriate for TTS consultation and their recommendations. Pt is in NAD and stable for holding in [facility].  Rolm Gala Aneta Hendershott, PA-C 09/04/15 1931  Cathren Laine, MD 09/06/15 [phone]

## 2015-09-04 NOTE — ED Notes (Signed)
Patient arrived to the unit. Pt states he is depressed due to a recent problem with his girlfriend. Pt fabricated a suicide attempt to try and get his girlfriend to fell badly for him. Pt denies SI or plans to harm himself at this time. Pt verbally contracts for safety.

## 2015-09-04 NOTE — Discharge Instructions (Signed)
Major Depressive Disorder Major depressive disorder is a mental illness. It also may be called clinical depression or unipolar depression. Major depressive disorder usually causes feelings of sadness, hopelessness, or helplessness. Some people with this disorder do not feel particularly sad but lose interest in doing things they used to enjoy (anhedonia). Major depressive disorder also can cause physical symptoms. It can interfere with work, school, relationships, and other normal everyday activities. The disorder varies in severity but is longer lasting and more serious than the sadness we all feel from time to time in our lives. Major depressive disorder often is triggered by stressful life events or major life changes. Examples of these triggers include divorce, loss of your job or home, a move, and the death of a family member or close friend. Sometimes this disorder occurs for no obvious reason at all. People who have family members with major depressive disorder or bipolar disorder are at higher risk for developing this disorder, with or without life stressors. Major depressive disorder can occur at any age. It may occur just once in your life (single episode major depressive disorder). It may occur multiple times (recurrent major depressive disorder). SYMPTOMS People with major depressive disorder have either anhedonia or depressed mood on nearly a daily basis for at least 2 weeks or longer. Symptoms of depressed mood include:  Feelings of sadness (blue or down in the dumps) or emptiness.  Feelings of hopelessness or helplessness.  Tearfulness or episodes of crying (may be observed by others).  Irritability (children and adolescents). In addition to depressed mood or anhedonia or both, people with this disorder have at least four of the following symptoms:  Difficulty sleeping or sleeping too much.   Significant change (increase or decrease) in appetite or weight.   Lack of energy or  motivation.  Feelings of guilt and worthlessness.   Difficulty concentrating, remembering, or making decisions.  Unusually slow movement (psychomotor retardation) or restlessness (as observed by others).   Recurrent wishes for death, recurrent thoughts of self-harm (suicide), or a suicide attempt. People with major depressive disorder commonly have persistent negative thoughts about themselves, other people, and the world. People with severe major depressive disorder may experiencedistorted beliefs or perceptions about the world (psychotic delusions). They also may see or hear things that are not real (psychotic hallucinations). DIAGNOSIS Major depressive disorder is diagnosed through an assessment by your health care provider. Your health care provider will ask aboutaspects of your daily life, such as mood,sleep, and appetite, to see if you have the diagnostic symptoms of major depressive disorder. Your health care provider may ask about your medical history and use of alcohol or drugs, including prescription medicines. Your health care provider also may do a physical exam and blood work. This is because certain medical conditions and the use of certain substances can cause major depressive disorder-like symptoms (secondary depression). Your health care provider also may refer you to a mental health specialist for further evaluation and treatment. TREATMENT It is important to recognize the symptoms of major depressive disorder and seek treatment. The following treatments can be prescribed for this disorder:   Medicine. Antidepressant medicines usually are prescribed. Antidepressant medicines are thought to correct chemical imbalances in the brain that are commonly associated with major depressive disorder. Other types of medicine may be added if the symptoms do not respond to antidepressant medicines alone or if psychotic delusions or hallucinations occur.  Talk therapy. Talk therapy can be  helpful in treating major depressive disorder by providing   support, education, and guidance. Certain types of talk therapy also can help with negative thinking (cognitive behavioral therapy) and with relationship issues that trigger this disorder (interpersonal therapy). A mental health specialist can help determine which treatment is best for you. Most people with major depressive disorder do well with a combination of medicine and talk therapy. Treatments involving electrical stimulation of the brain can be used in situations with extremely severe symptoms or when medicine and talk therapy do not work over time. These treatments include electroconvulsive therapy, transcranial magnetic stimulation, and vagal nerve stimulation.   This information is not intended to replace advice given to you by your health care provider. Make sure you discuss any questions you have with your health care provider.   Document Released: 11/18/2012 Document Revised: 08/14/2014 Document Reviewed: 11/18/2012 Elsevier Interactive Patient Education 2016 [employer]  

## 2015-09-04 NOTE — ED Notes (Signed)
Pt. Is unable to urinate at this time but urinale is at bedside.

## 2015-09-04 NOTE — ED Notes (Signed)
Bed: WA15 Expected date:  Expected time:  Means of arrival:  Comments: EMS  

## 2015-09-04 NOTE — ED Notes (Signed)
Belongings bag given to patient's mother to take home.

## 2015-09-04 NOTE — ED Notes (Signed)
Patient's mother talking with the patient. Patient is crying and stated he did not want to be here any longer. Patient's mother asked what did he mean and he answered,"I don't want to live any longer."

## 2015-09-04 NOTE — ED Notes (Signed)
Patient has one bag of belongings in locker 26. 

## 2015-09-04 NOTE — BH Assessment (Signed)
Per Leonette Most, NP - patient does not meet criteria for inpatient hospitalization.  Writer obtained collateral information from the patient parents who reports that the patient was not trying to harm himself.  Patient does not have a psychiatric history.  The family reports that they feel safe with the patient returning home.  Writer gave referral information for the patient to follow up with [facility] intensive outpatient program. Writer agreed to go to therapy for his feeling of depression related to the break up from his girlfriend.   Writer informed the ER MD of the disposition.

## 2016-11-19 ENCOUNTER — Emergency Department (HOSPITAL_COMMUNITY)
Admission: EM | Admit: 2016-11-19 | Discharge: 2016-11-19 | Disposition: A | Discharge status: Home / Self Care | Payer: BLUE CROSS/BLUE SHIELD | Attending: Emergency Medicine | Admitting: Emergency Medicine

## 2016-11-19 ENCOUNTER — Encounter (HOSPITAL_COMMUNITY): Payer: Self-pay | Admitting: Emergency Medicine

## 2016-11-19 DIAGNOSIS — Y929 Unspecified place or not applicable: Secondary | ICD-10-CM | POA: Insufficient documentation

## 2016-11-19 DIAGNOSIS — Y999 Unspecified external cause status: Secondary | ICD-10-CM | POA: Insufficient documentation

## 2016-11-19 DIAGNOSIS — W228XXA Striking against or struck by other objects, initial encounter: Secondary | ICD-10-CM | POA: Diagnosis not present

## 2016-11-19 DIAGNOSIS — Y939 Activity, unspecified: Secondary | ICD-10-CM | POA: Diagnosis not present

## 2016-11-19 DIAGNOSIS — S0990XA Unspecified injury of head, initial encounter: Secondary | ICD-10-CM | POA: Diagnosis present

## 2016-11-19 LAB — BASIC METABOLIC PANEL
Anion gap: 5 (ref 5–15)
BUN: 13 mg/dL (ref 6–20)
CHLORIDE: 107 mmol/L (ref 101–111)
CO2: 27 mmol/L (ref 22–32)
CREATININE: 1.01 mg/dL (ref 0.61–1.24)
Calcium: 9 mg/dL (ref 8.9–10.3)
GFR calc Af Amer: >60 mL/min (ref >60)
GLUCOSE: 88 mg/dL (ref 65–99)
Potassium: 3.8 mmol/L (ref 3.5–5.1)
SODIUM: 139 mmol/L (ref 135–145)

## 2016-11-19 LAB — CBC WITH DIFFERENTIAL/PLATELET
Basophils Absolute: 0 10*3/uL (ref 0.0–0.1)
Basophils Relative: 0 %
EOS ABS: 0.1 10*3/uL (ref 0.0–0.7)
EOS PCT: 1 %
HCT: 40.5 % (ref 39.0–52.0)
Hemoglobin: 13.5 g/dL (ref 13.0–17.0)
LYMPHS ABS: 2.7 10*3/uL (ref 0.7–4.0)
Lymphocytes Relative: 50 %
MCH: 30 pg (ref 26.0–34.0)
MCHC: 33.3 g/dL (ref 30.0–36.0)
MCV: 90 fL (ref 78.0–100.0)
MONO ABS: 0.7 10*3/uL (ref 0.1–1.0)
Monocytes Relative: 14 %
Neutro Abs: 1.9 10*3/uL (ref 1.7–7.7)
Neutrophils Relative %: 35 %
PLATELETS: 152 10*3/uL (ref 150–400)
RBC: 4.5 MIL/uL (ref 4.22–5.81)
RDW: 12.6 % (ref 11.5–15.5)
WBC: 5.3 10*3/uL (ref 4.0–10.5)

## 2016-11-19 NOTE — ED Provider Notes (Signed)
[facility] Provider Note   CSN: [id number] Arrival date & time: 11/19/16  1330     History   Chief Complaint Chief Complaint  Patient presents with  . Head Injury  . Near Syncope    HPI Jordan Everett is a 31 y.o. male.  Patient presents with headache that began after he hit his head on his windowsill after an episode of syncope this morning. History is provided by friends and patient. Patient states that he had 1 syncopal episode after waking up and he is unsure of how long it lasted. After he woke up, he was getting himself situated when he felt his leg give out on him and he ended up hitting his head on his windowsill and was "out," for at least an hour, but friend is unsure of how long because she was waiting outside and he wouldn't answer the door.. States that his leg gave out on him because he has recently returned to the gym and it has felt sore. Prior to the first syncopal episode, he felt palpitations. He has a history of palpitations when he was younger which his doctor states was due to stress and possible murmur. He states that the palpitations are intermittent and are associated during times of stress and anxiety. He reports similar circumstances earlier this morning. He reports the pain is on the right side of his forehead where he hit the windowsill and has noticed some swelling there. Has not tried anything for pain. Patient is able to ambulate normally. He denies any vision changes, vomiting, additional injury, neck pain, use of blood thinners, limited range of motion, back pain, abdominal complaints, recent illness, fever.      Past Medical History:  Diagnosis Date  . Acute pharyngitis   . Acute sinusitis, unspecified   . Anemia   . Headache(784.0)   . Murmur   . Pneumonia   . Rash and other nonspecific skin eruption     There are no active problems to display for this patient.   Past Surgical History:  Procedure Laterality Date  . WISDOM TOOTH  EXTRACTION         Home Medications    Prior to Admission medications   Not on File    Family History No family history on file.  Social History Social History  Substance Use Topics  . Smoking status: Never Smoker  . Smokeless tobacco: Never Used  . Alcohol use Yes     Comment: social     Allergies   Penicillins   Review of Systems Review of Systems  Constitutional: Negative for appetite change, chills and fever.  HENT: Negative for ear pain, rhinorrhea, sneezing and sore throat.   Eyes: Negative for photophobia and visual disturbance.  Respiratory: Negative for cough, chest tightness, shortness of breath and wheezing.   Cardiovascular: Positive for palpitations. Negative for chest pain.  Gastrointestinal: Negative for abdominal pain, blood in stool, constipation, diarrhea, nausea and vomiting.  Genitourinary: Negative for dysuria, hematuria and urgency.  Musculoskeletal: Negative for back pain and myalgias.  Skin: Negative for rash.  Neurological: Positive for dizziness, syncope, light-headedness and headaches. Negative for weakness.  Psychiatric/Behavioral: Negative for confusion. The patient is not nervous/anxious.      Physical Exam Updated Vital Signs BP 129/76   Pulse 85   Temp 98.5 F (36.9 C) (Oral)   Resp 20   Ht  (1.778 m)   Wt 64.9 kg   SpO2 100%   BMI 20.52 kg/m  Physical Exam  Constitutional: He appears well-developed and well-nourished. No distress.  HENT:  Head: Normocephalic and atraumatic.    Right Ear: External ear normal.  Left Ear: External ear normal.  Nose: Nose normal.  Mouth/Throat: Uvula is midline. No oropharyngeal exudate.  Swelling on R forehead at site of hit.  Eyes: Conjunctivae and EOM are normal. Pupils are equal, round, and reactive to light. Right eye exhibits no discharge. Left eye exhibits no discharge. No scleral icterus.  Neck: Normal range of motion. Neck supple.  Cardiovascular: Normal rate, regular  rhythm, normal heart sounds and intact distal pulses.  Exam reveals no gallop and no friction rub.   No murmur heard. Pulmonary/Chest: Effort normal and breath sounds normal. No respiratory distress. He exhibits no tenderness.  Abdominal: Soft. Bowel sounds are normal. He exhibits no distension. There is no tenderness. There is no guarding.  Musculoskeletal: Normal range of motion. He exhibits no edema.  Lymphadenopathy:    He has no cervical adenopathy.  Neurological: He is alert. He has normal strength. No cranial nerve deficit or sensory deficit. He exhibits normal muscle tone. Coordination normal.  Skin: Skin is warm and dry. No rash noted. He is not diaphoretic.  Psychiatric: He has a normal mood and affect.  Nursing note and vitals reviewed.    ED Treatments / Results  Labs (all labs ordered are listed, but only abnormal results are displayed) Labs Reviewed  BASIC METABOLIC PANEL  CBC WITH DIFFERENTIAL/PLATELET    EKG  EKG Interpretation None       Radiology No results found.  Procedures Procedures (including critical care time)  Medications Ordered in ED Medications - No data to display   Initial Impression / Assessment and Plan / ED Course  I have reviewed the triage vital signs and the nursing notes.  Pertinent labs & imaging results that were available during my care of the patient were reviewed by me and considered in my medical decision making (see chart for details).     Patient's history and symptoms concerning for syncope versus concussion versus dehydration versus arrhythmia versus anxiety. Patient endorses a history of palpitations during times of stress. EKG was obtained which showed normal sinus rhythm with no evidence of WPW, Brugada, long QT, LVH. CBC and BMP showed no leukocytosis, anemia, electrolyte abnormalities. Neurological exam revealed no focal deficits. Patient is alert and oriented 4. He is able to ambulate without trouble. No changes  with memory. Head imaging not warranted at this time based on absence of neurological deficit, absence of vomiting, no additional injury, no use of blood thinners, no vision changes. History and symptoms do not seem to be related to cardiac origin. Could be due to anxiety or stress. Advised patient to follow up with cardiology or primary care for further evaluation. Return precautions given.  Final Clinical Impressions(s) / ED Diagnoses   Final diagnoses:  Injury of head, initial encounter    New Prescriptions New Prescriptions   No medications on file     Dietrich Pates, Cordelia Poche 11/19/16 1616    Gerhard Munch, MD 11/19/16 [phone]

## 2016-11-19 NOTE — Discharge Instructions (Signed)
Take ibuprofen or Tylenol as needed for headache. Follow up with cardiology or PCP for further evaluation. Return to ED for worsening pain, additional syncope or injury, trouble breathing, numbness, weakness, vision changes.

## 2016-11-19 NOTE — ED Triage Notes (Signed)
Pt. Stated, I think I passed out around 9-10 this morning. I hit the window seal to the right side of my head.  I just need to be sure I don't have a concussion. Alert and oriented, x 4 . Pupils equal and reactive to light.

## 2016-11-19 NOTE — ED Notes (Signed)
Care handoff to [facility]

## 2019-02-07 ENCOUNTER — Encounter (HOSPITAL_COMMUNITY): Payer: Self-pay

## 2019-02-07 ENCOUNTER — Other Ambulatory Visit: Payer: Self-pay

## 2019-02-07 ENCOUNTER — Ambulatory Visit (HOSPITAL_COMMUNITY)
Admission: EM | Admit: 2019-02-07 | Discharge: 2019-02-07 | Disposition: A | Discharge status: Home / Self Care | Payer: BLUE CROSS/BLUE SHIELD | Attending: Family Medicine | Admitting: Family Medicine

## 2019-02-07 DIAGNOSIS — J069 Acute upper respiratory infection, unspecified: Secondary | ICD-10-CM

## 2019-02-07 NOTE — ED Triage Notes (Signed)
Pt presents with symptoms of sinus infection; facial pressure, facial swelling/pain. nasal drainage and productive cough X 5 days

## 2019-02-07 NOTE — ED Provider Notes (Signed)
[facility]   [id number] 02/07/19 Arrival Time: 1128  ASSESSMENT & PLAN:  1. Acute upper respiratory infection    Symptoms have resolved. Work notes provided.  OTC symptom care as needed. Ensure adequate fluid intake and rest. May f/u with PCP or here as needed.  Reviewed expectations re: course of current medical issues. Questions answered. Outlined signs and symptoms indicating need for more acute intervention. Patient verbalized understanding. After Visit Summary given.   SUBJECTIVE: History from: patient.  Karl ItoJames R Keithly is a 33 y.o. male who presents with complaint of nasal congestion, post-nasal drainage, and a persistent dry cough; without sore throat. Onset abrupt, approx 5-6 days ago; without fatigue and without body aches. SOB: none. Wheezing: none. Fever: no. Overall normal PO intake without n/v. Known sick contacts: no. No specific or significant aggravating or alleviating factors reported. OTC treatment: cough medication at night; helped. Symptoms have all resolved at this time. Needs work notes.  Social History   Tobacco Use  Smoking Status Never Smoker  Smokeless Tobacco Never Used    ROS: As per HPI.   OBJECTIVE:  Vitals:   02/07/19 1259  BP: 117/71  Pulse: 88  Resp: 18  Temp: 98.4 F (36.9 C)  TempSrc: Oral  SpO2: 100%     General appearance: alert; appears fatigued HEENT: nasal congestion; clear runny nose; throat irritation secondary to post-nasal drainage Neck: supple without LAD CV: RRR Lungs: unlabored respirations, symmetrical air entry without wheezing; cough: absent Abd: soft Ext: no LE edema Skin: warm and dry Psychological: alert and cooperative; normal mood and affect    Allergies  Allergen Reactions  . Penicillins Hives and Other (See Comments)    Has patient had a PCN reaction causing immediate rash, facial/tongue/throat swelling, SOB or lightheadedness with hypotension: No Has patient had a PCN reaction  causing severe rash involving mucus membranes or skin necrosis: Yes Has patient had a PCN reaction that required hospitalization No Has patient had a PCN reaction occurring within the last 10 years: No If all of the above answers are "NO", then may proceed with Cephalosporin use.     Past Medical History:  Diagnosis Date  . Acute pharyngitis   . Acute sinusitis, unspecified   . Anemia   . Headache(784.0)   . Murmur   . Pneumonia   . Rash and other nonspecific skin eruption    History reviewed. No pertinent family history. Social History   Socioeconomic History  . Marital status: Single    Spouse name: Not on file  . Number of children: Not on file  . Years of education: Not on file  . Highest education level: Not on file  Occupational History  . Not on file  Social Needs  . Financial resource strain: Not on file  . Food insecurity    Worry: Not on file    Inability: Not on file  . Transportation needs    Medical: Not on file    Non-medical: Not on file  Tobacco Use  . Smoking status: Never Smoker  . Smokeless tobacco: Never Used  Substance and Sexual Activity  . Alcohol use: Yes    Comment: social  . Drug use: No  . Sexual activity: Not on file  Lifestyle  . Physical activity    Days per week: Not on file    Minutes per session: Not on file  . Stress: Not on file  Relationships  . Social connections    Talks on phone: Not on file  Gets together: Not on file    Attends religious service: Not on file    Active member of club or organization: Not on file    Attends meetings of clubs or organizations: Not on file    Relationship status: Not on file  . Intimate partner violence    Fear of current or ex partner: Not on file    Emotionally abused: Not on file    Physically abused: Not on file    Forced sexual activity: Not on file  Other Topics Concern  . Not on file  Social History Narrative  . Not on file           Vanessa Kick, MD 02/07/19  1322

## 2019-09-10 ENCOUNTER — Encounter (HOSPITAL_COMMUNITY): Payer: Self-pay

## 2019-09-10 ENCOUNTER — Other Ambulatory Visit: Payer: Self-pay

## 2019-09-10 ENCOUNTER — Ambulatory Visit (HOSPITAL_COMMUNITY)
Admission: EM | Admit: 2019-09-10 | Discharge: 2019-09-10 | Disposition: A | Discharge status: Home / Self Care | Payer: 59 | Attending: Family Medicine | Admitting: Family Medicine

## 2019-09-10 DIAGNOSIS — M62830 Muscle spasm of back: Secondary | ICD-10-CM | POA: Diagnosis not present

## 2019-09-10 DIAGNOSIS — M545 Low back pain, unspecified: Secondary | ICD-10-CM

## 2019-09-10 MED ORDER — DICLOFENAC SODIUM 75 MG PO TBEC: 75.0000 mg | DELAYED_RELEASE_TABLET | Freq: Two times a day (BID) | ORAL | 0 refills | Status: DC

## 2019-09-10 MED ORDER — CYCLOBENZAPRINE HCL 10 MG PO TABS: ORAL_TABLET | ORAL | 0 refills | Status: DC

## 2019-09-10 NOTE — ED Triage Notes (Signed)
Pt state he has pain in his lower back raditing down into his right leg. Pt states this started yesterday.

## 2019-09-10 NOTE — Discharge Instructions (Signed)

## 2019-09-11 NOTE — ED Provider Notes (Signed)
[facility]   [id number] 09/10/19 Arrival Time: 1408  ASSESSMENT & PLAN:  1. Muscle spasm of back   2. Acute right-sided low back pain without sciatica     Able to ambulate here and hemodynamically stable. No indication for imaging of back at this time given no trauma and normal neurological exam. Discussed.  Meds ordered this encounter  Medications  . cyclobenzaprine (FLEXERIL) 10 MG tablet    Sig: Take 1 tablet by mouth 3 times daily as needed for muscle spasm. Warning: May cause drowsiness.    Dispense:  21 tablet    Refill:  0  . diclofenac (VOLTAREN) 75 MG EC tablet    Sig: Take 1 tablet (75 mg total) by mouth 2 (two) times daily.    Dispense:  14 tablet    Refill:  0    Medication sedation precautions given. Encourage ROM/movement as tolerated.  Recommend: Follow-up Information    [facility].   Why: If worsening or failing to improve as anticipated. Contact information: [address] [place] [address] [phone]          Reviewed expectations re: course of current medical issues. Questions answered. Outlined signs and symptoms indicating need for more acute intervention. Patient verbalized understanding. After Visit Summary given.   SUBJECTIVE: History from: patient.  Jordan Everett is a 34 y.o. male who presents with complaint of intermittent right sided upper back discomfort. Onset gradual. First noted yesterday. Injury/trama: none reported. History of back problems requiring medical care: none reported. Pain described as aching and with occasional sharp pains that does not radiate. Aggravating factors: certain movements. Alleviating factors: have not been identified. Progressive LE weakness or saddle anesthesia: none. Extremity sensation changes or weakness: none. Ambulatory without difficulty. Normal bowel/bladder habits: yes; without urinary retention. Normal PO intake without n/v. No  associated abdominal pain/n/v. Self treatment: has has not tried OTC therapies.  Reports no chronic steroid use, fevers, IV drug use, or recent back surgeries or procedures.    OBJECTIVE:  Vitals:   09/10/19 1506  BP: 140/74  Pulse: 91  Resp: 16  Temp: 98.3 F (36.8 C)  TempSrc: Oral  SpO2: 100%  Weight: 68.9 kg    General appearance: alert; no distress HEENT: [place]; AT Neck: supple with FROM; without midline tenderness CV: RRR Lungs: unlabored respirations; symmetrical air entry Abdomen: soft, non-tender; non-distended Back: moderate and poorly localized tenderness to palpation over R lumbar musculature; FROM at waist; bruising: none; without midline tenderness Extremities: without edema; symmetrical without gross deformities; normal ROM of RLE Skin: warm and dry Neurologic: normal gait; normal sensation and strength of RLE Psychological: alert and cooperative; normal mood and affect   Allergies  Allergen Reactions  . Penicillins Hives and Other (See Comments)    Has patient had a PCN reaction causing immediate rash, facial/tongue/throat swelling, SOB or lightheadedness with hypotension: No Has patient had a PCN reaction causing severe rash involving mucus membranes or skin necrosis: Yes Has patient had a PCN reaction that required hospitalization No Has patient had a PCN reaction occurring within the last 10 years: No If all of the above answers are "NO", then may proceed with Cephalosporin use.     Past Medical History:  Diagnosis Date  . Acute pharyngitis   . Acute sinusitis, unspecified   . Anemia   . Headache(784.0)   . Murmur   . Pneumonia   . Rash and other nonspecific skin eruption  Social History   Socioeconomic History  . Marital status: Single    Spouse name: Not on file  . Number of children: Not on file  . Years of education: Not on file  . Highest education level: Not on file  Occupational History  . Not on file  Tobacco Use  . Smoking  status: Never Smoker  . Smokeless tobacco: Never Used  Substance and Sexual Activity  . Alcohol use: Yes    Comment: social  . Drug use: No  . Sexual activity: Not on file  Other Topics Concern  . Not on file  Social History Narrative  . Not on file   Social Determinants of Health   Financial Resource Strain:   . Difficulty of Paying Living Expenses: Not on file  Food Insecurity:   . Worried About Charity fundraiser in the Last Year: Not on file  . Ran Out of Food in the Last Year: Not on file  Transportation Needs:   . Lack of Transportation (Medical): Not on file  . Lack of Transportation (Non-Medical): Not on file  Physical Activity:   . Days of Exercise per Week: Not on file  . Minutes of Exercise per Session: Not on file  Stress:   . Feeling of Stress : Not on file  Social Connections:   . Frequency of Communication with Friends and Family: Not on file  . Frequency of Social Gatherings with Friends and Family: Not on file  . Attends Religious Services: Not on file  . Active Member of Clubs or Organizations: Not on file  . Attends Archivist Meetings: Not on file  . Marital Status: Not on file  Intimate Partner Violence:   . Fear of Current or Ex-Partner: Not on file  . Emotionally Abused: Not on file  . Physically Abused: Not on file  . Sexually Abused: Not on file   History reviewed. No pertinent family history. Past Surgical History:  Procedure Laterality Date  . WISDOM TOOTH EXTRACTION       Vanessa Kick, MD 09/11/19 [phone]

## 2019-10-30 ENCOUNTER — Other Ambulatory Visit: Payer: Self-pay

## 2019-10-30 ENCOUNTER — Ambulatory Visit: Payer: 59 | Attending: Family Medicine | Admitting: Family Medicine

## 2020-04-27 ENCOUNTER — Other Ambulatory Visit: Payer: Self-pay

## 2020-04-27 ENCOUNTER — Ambulatory Visit: Payer: 59 | Attending: Internal Medicine | Admitting: Family

## 2020-04-27 ENCOUNTER — Encounter: Payer: Self-pay | Admitting: Family

## 2020-04-27 VITALS — BP 114/72 | HR 82 | Temp 98.5°F | Resp 16 | Ht 69.0 in | Wt 168.6 lb

## 2020-04-27 DIAGNOSIS — Z7689 Persons encountering health services in other specified circumstances: Secondary | ICD-10-CM | POA: Diagnosis not present

## 2020-04-27 NOTE — Progress Notes (Signed)
New Patient Office Visit  Subjective:  Patient ID: Jordan Everett, male    DOB: Nov 28, 1985  Age: 34 y.o. MRN: [id number]  CC: Establish Care  HPI Jordan Everett is a 34 year old male with history of acute pharyngitis, acute sinusitis, anemia, headache, murmur (childhood), and pneumonia presents to establish care. Reports his previous primary doctor was with [employer].  Past Medical History:  Diagnosis Date  . Acute pharyngitis   . Acute sinusitis, unspecified   . Anemia   . Headache(784.0)   . Murmur   . Pneumonia   . Rash and other nonspecific skin eruption     Past Surgical History:  Procedure Laterality Date  . WISDOM TOOTH EXTRACTION      No family history on file.   Social History   Socioeconomic History  . Marital status: Single    Spouse name: Not on file  . Number of children: Not on file  . Years of education: Not on file  . Highest education level: Not on file  Occupational History  . Not on file  Tobacco Use  . Smoking status: Never Smoker  . Smokeless tobacco: Never Used  Substance and Sexual Activity  . Alcohol use: Yes    Comment: social  . Drug use: No  . Sexual activity: Not on file  Other Topics Concern  . Not on file  Social History Narrative  . Not on file   Social Determinants of Health   Financial Resource Strain:   . Difficulty of Paying Living Expenses: Not on file  Food Insecurity:   . Worried About Programme researcher, broadcasting/film/video in the Last Year: Not on file  . Ran Out of Food in the Last Year: Not on file  Transportation Needs:   . Lack of Transportation (Medical): Not on file  . Lack of Transportation (Non-Medical): Not on file  Physical Activity:   . Days of Exercise per Week: Not on file  . Minutes of Exercise per Session: Not on file  Stress:   . Feeling of Stress : Not on file  Social Connections:   . Frequency of Communication with Friends and Family: Not on file  . Frequency of Social Gatherings with Friends and  Family: Not on file  . Attends Religious Services: Not on file  . Active Member of Clubs or Organizations: Not on file  . Attends Banker Meetings: Not on file  . Marital Status: Not on file  Intimate Partner Violence:   . Fear of Current or Ex-Partner: Not on file  . Emotionally Abused: Not on file  . Physically Abused: Not on file  . Sexually Abused: Not on file    ROS Review of Systems  Review of Systems:  Constitutional: Negative for fever, chills weight loss HENT: Negative for ear pain, nosebleeds, congestion, facial swelling, rhinorrhea, neck pain, neck stiffness and ear discharge.   Respiratory: Negative for cough, shortness of breath, wheezing  Cardiovascular: Negative for chest pain, palpitations and leg swelling.  Gastrointestinal: Negative for heartburn, abdominal pain, vomiting, diarrhea or consitpation Genitourinary: Negative for dysuria, urgency, frequency, hematuria Musculoskeletal: Negative for back pain or joint pain; reports a left pectoral lump which appeared a couple months ago after exercising with weights, denies pain, reports initially firm and now its soft, reports it seems to be smaller since it began Neurological: Negative for dizziness, seizures, syncope, focal weakness,  numbness and headaches.  Hematological: Does not bruise/bleed easily.  Psychiatric/Behavioral: Negative for hallucinations, confusion,  dysphoric mood  Objective:   Today's Vitals:  Vitals with BMI 04/27/2020 09/10/2019 02/07/2019  Height 5\' 9"  - -  Weight 168 lbs 10 oz 152 lbs -  BMI 24.89 - -  Systolic 114 140  Diastolic 72 74 71  Pulse 82 91 88   Wt Readings from Last 3 Encounters:  04/27/20 168 lb 9.6 oz (76.5 kg)  09/10/19 152 lb (68.9 kg)  11/19/16 143 lb (64.9 kg)    Physical Exam  General appearance - alert, well appearing, and in no distress and oriented to person, place, and time Mental status - alert, oriented to person, place, and time, normal mood,  behavior, speech, dress, motor activity, and thought processes Neck - supple, no significant adenopathy Lymphatics - no palpable lymphadenopathy, no hepatosplenomegaly Chest - clear to auscultation, no wheezes, rales or rhonchi, symmetric air entry, no tachypnea, retractions or cyanosis Heart - normal rate, regular rhythm, normal S1, S2, no murmurs, rubs, clicks or gallops Musculoskeletal - no joint tenderness, deformity or swelling, no muscular tenderness noted, left pectoral soft tissue movable lump without erythema, drainage, and pain   Assessment & Plan:  1. Encounter to establish care: - Patient to establish care today.  - Follow-up with primary physician in 1 month for physical examination and lab work.  - Counseled patient to watch left pectoral lump for edema, erythema, tenderness, warmth, and drainage and to notify provider should symptoms worsen. Patient verbalized understanding. Follow-up with primary physician in 1 month or sooner if needed.  Outpatient Encounter Medications as of 04/27/2020  Medication Sig  . cyclobenzaprine (FLEXERIL) 10 MG tablet Take 1 tablet by mouth 3 times daily as needed for muscle spasm. Warning: May cause drowsiness.  . diclofenac (VOLTAREN) 75 MG EC tablet Take 1 tablet (75 mg total) by mouth 2 (two) times daily.   No facility-administered encounter medications on file as of 04/27/2020.   Follow-up: No follow-ups on file.   04/29/2020, NP

## 2020-04-27 NOTE — Patient Instructions (Signed)
Follow-up with primary physician in 1 month for physical examination and lab work.  Health Maintenance, Male Adopting a healthy lifestyle and getting preventive care are important in promoting health and wellness. Ask your health care provider about:  The right schedule for you to have regular tests and exams.  Things you can do on your own to prevent diseases and keep yourself healthy. What should I know about diet, weight, and exercise? Eat a healthy diet   Eat a diet that includes plenty of vegetables, fruits, low-fat dairy products, and lean protein.  Do not eat a lot of foods that are high in solid fats, added sugars, or sodium. Maintain a healthy weight Body mass index (BMI) is a measurement that can be used to identify possible weight problems. It estimates body fat based on height and weight. Your health care provider can help determine your BMI and help you achieve or maintain a healthy weight. Get regular exercise Get regular exercise. This is one of the most important things you can do for your health. Most adults should:  Exercise for at least 150 minutes each week. The exercise should increase your heart rate and make you sweat (moderate-intensity exercise).  Do strengthening exercises at least twice a week. This is in addition to the moderate-intensity exercise.  Spend less time sitting. Even light physical activity can be beneficial. Watch cholesterol and blood lipids Have your blood tested for lipids and cholesterol at 32 years of age, then have this test every 5 years. You may need to have your cholesterol levels checked more often if:  Your lipid or cholesterol levels are high.  You are older than 32 years of age.  You are at high risk for heart disease. What should I know about cancer screening? Many types of cancers can be detected early and may often be prevented. Depending on your health history and family history, you may need to have cancer screening at  various ages. This may include screening for:  Colorectal cancer.  Prostate cancer.  Skin cancer.  Lung cancer. What should I know about heart disease, diabetes, and high blood pressure? Blood pressure and heart disease  High blood pressure causes heart disease and increases the risk of stroke. This is more likely to develop in people who have high blood pressure readings, are of African descent, or are overweight.  Talk with your health care provider about your target blood pressure readings.  Have your blood pressure checked: ? Every 3-5 years if you are 20-17 years of age. ? Every year if you are 71 years old or older.  If you are between the ages of 34 and 21 and are a current or former smoker, ask your health care provider if you should have a one-time screening for abdominal aortic aneurysm (AAA). Diabetes Have regular diabetes screenings. This checks your fasting blood sugar level. Have the screening done:  Once every three years after age 3 if you are at a normal weight and have a low risk for diabetes.  More often and at a younger age if you are overweight or have a high risk for diabetes. What should I know about preventing infection? Hepatitis B If you have a higher risk for hepatitis B, you should be screened for this virus. Talk with your health care provider to find out if you are at risk for hepatitis B infection. Hepatitis C Blood testing is recommended for:  Everyone born from [birth year] through 1963.  Anyone with known risk factors  for hepatitis C. Sexually transmitted infections (STIs)  You should be screened each year for STIs, including gonorrhea and chlamydia, if: ? You are sexually active and are younger than 33 years of age. ? You are older than 33 years of age and your health care provider tells you that you are at risk for this type of infection. ? Your sexual activity has changed since you were last screened, and you are at increased risk for chlamydia  or gonorrhea. Ask your health care provider if you are at risk.  Ask your health care provider about whether you are at high risk for HIV. Your health care provider may recommend a prescription medicine to help prevent HIV infection. If you choose to take medicine to prevent HIV, you should first get tested for HIV. You should then be tested every 3 months for as long as you are taking the medicine. Follow these instructions at home: Lifestyle  Do not use any products that contain nicotine or tobacco, such as cigarettes, e-cigarettes, and chewing tobacco. If you need help quitting, ask your health care provider.  Do not use street drugs.  Do not share needles.  Ask your health care provider for help if you need support or information about quitting drugs. Alcohol use  Do not drink alcohol if your health care provider tells you not to drink.  If you drink alcohol: ? Limit how much you have to 0-2 drinks a day. ? Be aware of how much alcohol is in your drink. In the [place] one drink equals one 12 oz bottle of beer (355 mL), one 5 oz glass of wine (148 mL), or one 1 oz glass of hard liquor (44 mL). General instructions  Schedule regular health, dental, and eye exams.  Stay current with your vaccines.  Tell your health care provider if: ? You often feel depressed. ? You have ever been abused or do not feel safe at home. Summary  Adopting a healthy lifestyle and getting preventive care are important in promoting health and wellness.  Follow your health care provider's instructions about healthy diet, exercising, and getting tested or screened for diseases.  Follow your health care provider's instructions on monitoring your cholesterol and blood pressure. This information is not intended to replace advice given to you by your health care provider. Make sure you discuss any questions you have with your health care provider. Document Revised: 07/17/2018 Document Reviewed:  07/17/2018 Elsevier Patient Education  2020 [employer].

## 2020-06-07 ENCOUNTER — Ambulatory Visit: Payer: 59 | Admitting: Family Medicine

## 2020-06-11 ENCOUNTER — Ambulatory Visit: Payer: 59 | Admitting: Family Medicine

## 2020-08-21 ENCOUNTER — Other Ambulatory Visit: Payer: Self-pay

## 2020-08-21 DIAGNOSIS — Z20822 Contact with and (suspected) exposure to covid-19: Secondary | ICD-10-CM

## 2020-08-24 LAB — NOVEL CORONAVIRUS, NAA: SARS-CoV-2, NAA: NOT DETECTED

## 2022-01-24 ENCOUNTER — Ambulatory Visit
Admission: EM | Admit: 2022-01-24 | Discharge: 2022-01-24 | Disposition: A | Discharge status: Home / Self Care | Payer: Self-pay | Attending: Physician Assistant | Admitting: Physician Assistant

## 2022-01-24 ENCOUNTER — Ambulatory Visit (INDEPENDENT_AMBULATORY_CARE_PROVIDER_SITE_OTHER): Payer: Self-pay

## 2022-01-24 DIAGNOSIS — M25572 Pain in left ankle and joints of left foot: Secondary | ICD-10-CM

## 2022-01-24 NOTE — ED Triage Notes (Signed)
Patient presents to Urgent Care with complaints of left ankle pain since today. He states he was at work and a pallet landed on his ankle. Not taking anything for pain.   Denies numbness or tingling.

## 2022-01-24 NOTE — ED Provider Notes (Signed)
[facility]    CSN: [id number] Arrival date & time: 01/24/22  1803      History   Chief Complaint Chief Complaint  Patient presents with   Ankle Pain    Left ankle     HPI Jordan Everett is a 36 y.o. male.   Patient is a 36 year old male who presents with chief complaint of left ankle pain.  Patient states he was at work when a pallet fell on the medial aspect of his left ankle.  Patient states the pain was about a 9 but is improved to about a 6 while here.  Reported trouble walking on it.  States he did elevated but did not take any medications for it.  No other reported trauma or missteps.    Past Medical History:  Diagnosis Date   Acute pharyngitis    Acute sinusitis, unspecified    Anemia    Headache(784.0)    Murmur    Pneumonia    Rash and other nonspecific skin eruption     There are no problems to display for this patient.   Past Surgical History:  Procedure Laterality Date   WISDOM TOOTH EXTRACTION         Home Medications    Prior to Admission medications   Medication Sig Start Date End Date Taking? Authorizing Provider  cyclobenzaprine (FLEXERIL) 10 MG tablet Take 1 tablet by mouth 3 times daily as needed for muscle spasm. Warning: May cause drowsiness. Patient not taking: Reported on 04/27/2020 09/10/19   Mardella Layman, MD  diclofenac (VOLTAREN) 75 MG EC tablet Take 1 tablet (75 mg total) by mouth 2 (two) times daily. Patient not taking: Reported on 04/27/2020 09/10/19   Mardella Layman, MD    Family History History reviewed. No pertinent family history.  Social History Social History   Tobacco Use   Smoking status: Never   Smokeless tobacco: Never  Substance Use Topics   Alcohol use: Yes    Comment: social   Drug use: No     Allergies   Penicillins   Review of Systems Review of Systems as noted in HPI.  Other symptoms reviewed and found to be negative.   Physical Exam Triage Vital Signs ED Triage Vitals  Enc Vitals  Group     BP 01/24/22 1923 115/76     Pulse Rate 01/24/22 1923 74     Resp 01/24/22 1923 16     Temp 01/24/22 1923 98.2 F (36.8 C)     Temp Source 01/24/22 1923 Oral     SpO2 01/24/22 1923 98 %     Weight --      Height --      Head Circumference --      Peak Flow --      Pain Score 01/24/22 1921 6     Pain Loc --      Pain Edu? --      Excl. in GC? --    No data found.  Updated Vital Signs BP 115/76 (BP Location: Right Arm)   Pulse 74   Temp 98.2 F (36.8 C) (Oral)   Resp 16   SpO2 98%     Physical Exam Constitutional:      General: He is not in acute distress.    Appearance: Normal appearance.  Musculoskeletal:       Feet:  Neurological:     Mental Status: He is alert.      UC Treatments / Results  Labs (all  labs ordered are listed, but only abnormal results are displayed) Labs Reviewed - No data to display  EKG   Radiology DG Ankle Complete Left  Result Date: 01/24/2022 CLINICAL DATA:  Status post trauma with subsequent left ankle pain. EXAM: LEFT ANKLE COMPLETE - 3+ VIEW COMPARISON:  None Available. FINDINGS: There is no evidence of fracture, dislocation, or joint effusion. There is no evidence of arthropathy or other focal bone abnormality. Soft tissues are unremarkable. IMPRESSION: Negative. Electronically Signed   By: Aram Candela M.D.   On: 01/24/2022 19:51    Procedures Procedures (including critical care time)  Medications Ordered in UC Medications - No data to display  Initial Impression / Assessment and Plan / UC Course  I have reviewed the triage vital signs and the nursing notes.  Pertinent labs & imaging results that were available during my care of the patient were reviewed by me and considered in my medical decision making (see chart for details).    Patient with medial ankle pain left after having a pallet fall on the foot.  No obvious injury.  Some tenderness palpation.  X-ray negative for acute fracture.  Recommend rest,  ice, elevation in over-the-counter medicine for pain management. Follow alerts about Final Clinical Impressions(s) / UC Diagnoses   Final diagnoses:  Acute left ankle pain     Discharge Instructions      -no fractures noted on xray, likely bone bruise -rest, ice, and elevation as attached -ibuprofen and Tylenol as needed for pain     ED Prescriptions   None    PDMP not reviewed this encounter.   Candis Schatz, PA-C 01/24/22 1955

## 2022-01-24 NOTE — Discharge Instructions (Signed)
-  no fractures noted on xray, likely bone bruise -rest, ice, and elevation as attached -ibuprofen and Tylenol as needed for pain

## 2022-04-11 ENCOUNTER — Ambulatory Visit
Admission: EM | Admit: 2022-04-11 | Discharge: 2022-04-11 | Disposition: A | Discharge status: Home / Self Care | Payer: 59 | Attending: Emergency Medicine | Admitting: Emergency Medicine

## 2022-04-11 DIAGNOSIS — S29019A Strain of muscle and tendon of unspecified wall of thorax, initial encounter: Secondary | ICD-10-CM | POA: Diagnosis not present

## 2022-04-11 MED ORDER — IBUPROFEN 600 MG PO TABS: 600.0000 mg | ORAL_TABLET | Freq: Four times a day (QID) | ORAL | 0 refills | Status: DC | PRN

## 2022-04-11 MED ORDER — BACLOFEN 10 MG PO TABS: 10.0000 mg | ORAL_TABLET | Freq: Three times a day (TID) | ORAL | 0 refills | Status: DC

## 2022-04-11 NOTE — ED Triage Notes (Signed)
Patient states pain in mid back x day 2. Pain when take a deep breathe, sneezing.

## 2022-04-11 NOTE — Discharge Instructions (Addendum)
Take the ibuprofen, 600 mg every 6 hours with food, on a schedule for the next 48 hours and then as needed.  Take the baclofen, 10 mg every 8 hours, on a schedule for the next 48 hours and then as needed.  Apply moist heat to your back for 30 minutes at a time 2-3 times a day to improve blood flow to the area and help remove the lactic acid causing the spasm.  Follow the back exercises given at discharge.  Return for reevaluation for any new or worsening symptoms.  

## 2022-04-11 NOTE — ED Provider Notes (Signed)
[facility]    CSN: [id number] Arrival date & time: 04/11/22  0910      History   Chief Complaint No chief complaint on file.   HPI Jordan Everett is a 36 y.o. male.   HPI  36 year old male here for evaluation of mid back pain.  Patient reports he has been experiencing pain in his mid back for the last 2 days.  The pain increases with deep breathing, movement, sneezing, or coughing.  He does not remember any discrete injury or heavy lifting though he does work as a Leisure centre manager.  He has not had any increase in physical activity or new exercise routines.  He denies any previous injuries.  He also denies numbness, tingling, weakness in any of his extremities.  Past Medical History:  Diagnosis Date   Acute pharyngitis    Acute sinusitis, unspecified    Anemia    Headache(784.0)    Murmur    Pneumonia    Rash and other nonspecific skin eruption     There are no problems to display for this patient.   Past Surgical History:  Procedure Laterality Date   WISDOM TOOTH EXTRACTION         Home Medications    Prior to Admission medications   Medication Sig Start Date End Date Taking? Authorizing Provider  baclofen (LIORESAL) 10 MG tablet Take 1 tablet (10 mg total) by mouth 3 (three) times daily. 04/11/22  Yes Becky Augusta, NP  ibuprofen (ADVIL) 600 MG tablet Take 1 tablet (600 mg total) by mouth every 6 (six) hours as needed. 04/11/22  Yes Becky Augusta, NP    Family History No family history on file.  Social History Social History   Tobacco Use   Smoking status: Never   Smokeless tobacco: Never  Substance Use Topics   Alcohol use: Yes    Comment: social   Drug use: No     Allergies   Penicillins   Review of Systems Review of Systems  Constitutional:  Negative for fever.  Musculoskeletal:  Positive for back pain.  Skin:  Negative for color change.  Neurological:  Negative for weakness and numbness.  Hematological: Negative.    Psychiatric/Behavioral: Negative.       Physical Exam Triage Vital Signs ED Triage Vitals  Enc Vitals Group     BP 04/11/22 1024 118/80     Pulse Rate 04/11/22 1024 87     Resp 04/11/22 1024 17     Temp 04/11/22 1024 98.5 F (36.9 C)     Temp Source 04/11/22 1024 Oral     SpO2 04/11/22 1024 100 %     Weight 04/11/22 1029 170 lb (77.1 kg)     Height 04/11/22 1029 5\' 9"  (1.753 m)     Head Circumference --      Peak Flow --      Pain Score 04/11/22 1025 7     Pain Loc --      Pain Edu? --      Excl. in GC? --    No data found.  Updated Vital Signs BP 118/80 (BP Location: Left Arm)   Pulse 87   Temp 98.5 F (36.9 C) (Oral)   Resp 17   Ht 5\' 9"  (1.753 m)   Wt 170 lb (77.1 kg)   SpO2 100%   BMI 25.10 kg/m   Visual Acuity Right Eye Distance:   Left Eye Distance:   Bilateral Distance:    Right Eye Near:  Left Eye Near:    Bilateral Near:     Physical Exam Vitals and nursing note reviewed.  Constitutional:      Appearance: Normal appearance.  HENT:     Head: Normocephalic and atraumatic.  Cardiovascular:     Rate and Rhythm: Normal rate and regular rhythm.     Pulses: Normal pulses.     Heart sounds: Normal heart sounds. No murmur heard.    No friction rub. No gallop.  Pulmonary:     Effort: Pulmonary effort is normal.     Breath sounds: Normal breath sounds. No wheezing, rhonchi or rales.  Chest:     Chest wall: Tenderness present.  Musculoskeletal:        General: Tenderness present. No swelling, deformity or signs of injury.  Skin:    General: Skin is warm and dry.     Capillary Refill: Capillary refill takes less than 2 seconds.  Neurological:     General: No focal deficit present.     Mental Status: He is alert and oriented to person, place, and time.  Psychiatric:        Mood and Affect: Mood normal.        Behavior: Behavior normal.        Thought Content: Thought content normal.        Judgment: Judgment normal.      UC Treatments /  Results  Labs (all labs ordered are listed, but only abnormal results are displayed) Labs Reviewed - No data to display  EKG   Radiology No results found.  Procedures Procedures (including critical care time)  Medications Ordered in UC Medications - No data to display  Initial Impression / Assessment and Plan / UC Course  I have reviewed the triage vital signs and the nursing notes.  Pertinent labs & imaging results that were available during my care of the patient were reviewed by me and considered in my medical decision making (see chart for details).   Patient is a nontoxic-appearing 36 year old male here for evaluation of mid back pain has been going for last 2 days and does not involve any radiation to either of his extremities.  It is not associate with any known injury or increase in physical activity.  The pain does increase with movement, deep breathing, or cough and sneeze.  On exam patient has a normal axial carriage.  His cardiopulmonary exam reveals S1-S2 heart sounds with regular rate and rhythm and lung sounds are clear to auscultation in all fields.  He states he did have pain with deep respiration.  No cough.  He has no midline spinous tenderness for his thoracic or lumbar spine.  There is tension in the left lower thoracic paraspinous region.  No tension noted in the right.  He does have some mild tenderness with palpation of the sternum as well.  No pain with palpation to the left or right anterior chest wall.  Patient's exam is consistent with thoracic strain.  He might have some mild costochondritis to go with it.  I will treat him with ibuprofen, baclofen, home physical therapy, and moist heat.  I have also given him a work note to be off for the next 2 days.   Final Clinical Impressions(s) / UC Diagnoses   Final diagnoses:  Thoracic myofascial strain, initial encounter     Discharge Instructions      Take the ibuprofen, 600 mg every 6 hours with food, on a  schedule for the next 48 hours  and then as needed.  Take the baclofen, 10 mg every 8 hours, on a schedule for the next 48 hours and then as needed.  Apply moist heat to your back for 30 minutes at a time 2-3 times a day to improve blood flow to the area and help remove the lactic acid causing the spasm.  Follow the back exercises given at discharge.  Return for reevaluation for any new or worsening symptoms.      ED Prescriptions     Medication Sig Dispense Auth. Provider   ibuprofen (ADVIL) 600 MG tablet Take 1 tablet (600 mg total) by mouth every 6 (six) hours as needed. 30 tablet Margarette Canada, NP   baclofen (LIORESAL) 10 MG tablet Take 1 tablet (10 mg total) by mouth 3 (three) times daily. 88 each Margarette Canada, NP      PDMP not reviewed this encounter.   Margarette Canada, NP 04/11/22 1105

## 2022-07-28 ENCOUNTER — Ambulatory Visit
Admission: EM | Admit: 2022-07-28 | Discharge: 2022-07-28 | Disposition: A | Discharge status: Home / Self Care | Payer: 59 | Attending: Physician Assistant | Admitting: Physician Assistant

## 2022-07-28 ENCOUNTER — Encounter: Payer: Self-pay | Admitting: Emergency Medicine

## 2022-07-28 DIAGNOSIS — R051 Acute cough: Secondary | ICD-10-CM

## 2022-07-28 DIAGNOSIS — J019 Acute sinusitis, unspecified: Secondary | ICD-10-CM | POA: Diagnosis not present

## 2022-07-28 MED ORDER — IPRATROPIUM BROMIDE 0.06 % NA SOLN: 2.0000 | Freq: Four times a day (QID) | NASAL | 0 refills | Status: DC

## 2022-07-28 MED ORDER — PSEUDOEPH-BROMPHEN-DM 30-2-10 MG/5ML PO SYRP: 10.0000 mL | ORAL_SOLUTION | Freq: Four times a day (QID) | ORAL | 0 refills | Status: AC | PRN

## 2022-07-28 MED ORDER — DOXYCYCLINE HYCLATE 100 MG PO CAPS: 100.0000 mg | ORAL_CAPSULE | Freq: Two times a day (BID) | ORAL | 0 refills | Status: AC

## 2022-07-28 NOTE — ED Provider Notes (Signed)
[facility]    CSN: [id number] Arrival date & time: 07/28/22  1258      History   Chief Complaint Chief Complaint  Patient presents with   Headache   Cough    HPI Jordan Everett is a 36 y.o. male presenting for 2-week history of cough and congestion.  Patient reports over the past couple days his symptoms have acutely worsened after they seem to be getting better.  He reports that his nasal drainage and sputum production changed from clear to yellowish-green.  He reports sinus pain at this time.  He also reports a deep cough.  No fever, wheezing or breathing difficulty.  Taking OTC cough medication without relief.  Reports a history of pneumonia and sinus infections.  No sick contacts.  No other complaints.  HPI  Past Medical History:  Diagnosis Date   Acute pharyngitis    Acute sinusitis, unspecified    Anemia    Headache(784.0)    Murmur    Pneumonia    Rash and other nonspecific skin eruption     There are no problems to display for this patient.   Past Surgical History:  Procedure Laterality Date   WISDOM TOOTH EXTRACTION         Home Medications    Prior to Admission medications   Medication Sig Start Date End Date Taking? Authorizing Provider  brompheniramine-pseudoephedrine-DM 30-2-10 MG/5ML syrup Take 10 mLs by mouth 4 (four) times daily as needed for up to 7 days. 07/28/22 08/04/22 Yes Shirlee Latch, PA-C  doxycycline (VIBRAMYCIN) 100 MG capsule Take 1 capsule (100 mg total) by mouth 2 (two) times daily for 7 days. 07/28/22 08/04/22 Yes Eusebio Friendly B, PA-C  ipratropium (ATROVENT) 0.06 % nasal spray Place 2 sprays into both nostrils 4 (four) times daily. 07/28/22  Yes Eusebio Friendly B, PA-C  ibuprofen (ADVIL) 600 MG tablet Take 1 tablet (600 mg total) by mouth every 6 (six) hours as needed. 04/11/22   Becky Augusta, NP    Family History History reviewed. No pertinent family history.  Social History Social History   Tobacco Use    Smoking status: Never   Smokeless tobacco: Never  Vaping Use   Vaping Use: Never used  Substance Use Topics   Alcohol use: Yes    Comment: social   Drug use: No     Allergies   Penicillins   Review of Systems Review of Systems  Constitutional:  Positive for fatigue. Negative for fever.  HENT:  Positive for congestion, rhinorrhea, sinus pressure and sinus pain. Negative for sore throat.   Respiratory:  Positive for cough. Negative for shortness of breath.   Cardiovascular:  Negative for chest pain.  Gastrointestinal:  Negative for abdominal pain, diarrhea, nausea and vomiting.  Musculoskeletal:  Negative for myalgias.  Neurological:  Positive for headaches. Negative for weakness and light-headedness.  Hematological:  Negative for adenopathy.     Physical Exam Triage Vital Signs ED Triage Vitals  Enc Vitals Group     BP 07/28/22 1429 123/71     Pulse Rate 07/28/22 1429 96     Resp 07/28/22 1429 15     Temp 07/28/22 1429 98.8 F (37.1 C)     Temp Source 07/28/22 1429 Oral     SpO2 07/28/22 1429 98 %     Weight 07/28/22 1426 175 lb (79.4 kg)     Height 07/28/22 1426 5\' 9"  (1.753 m)     Head Circumference --  Peak Flow --      Pain Score 07/28/22 1426 3     Pain Loc --      Pain Edu? --      Excl. in [place]? --    No data found.  Updated Vital Signs BP 123/71 (BP Location: Right Arm)   Pulse 96   Temp 98.8 F (37.1 C) (Oral)   Resp 15   Ht 5\' 9"  (1.753 m)   Wt 175 lb (79.4 kg)   SpO2 98%   BMI 25.84 kg/m        Physical Exam Vitals and nursing note reviewed.  Constitutional:      General: He is not in acute distress.    Appearance: Normal appearance. He is well-developed. He is not ill-appearing.  HENT:     Head: Normocephalic and atraumatic.     Nose: Congestion present.     Mouth/Throat:     Mouth: Mucous membranes are moist.     Pharynx: Oropharynx is clear. Posterior oropharyngeal erythema present.  Eyes:     General: No scleral icterus.     Conjunctiva/sclera: Conjunctivae normal.  Cardiovascular:     Rate and Rhythm: Normal rate and regular rhythm.     Heart sounds: Normal heart sounds.  Pulmonary:     Effort: Pulmonary effort is normal. No respiratory distress.     Breath sounds: Normal breath sounds.  Musculoskeletal:     Cervical back: Neck supple.  Skin:    General: Skin is warm and dry.     Capillary Refill: Capillary refill takes less than 2 seconds.  Neurological:     General: No focal deficit present.     Mental Status: He is alert. Mental status is at baseline.     Motor: No weakness.     Gait: Gait normal.  Psychiatric:        Mood and Affect: Mood normal.        Behavior: Behavior normal.      UC Treatments / Results  Labs (all labs ordered are listed, but only abnormal results are displayed) Labs Reviewed - No data to display  EKG   Radiology No results found.  Procedures Procedures (including critical care time)  Medications Ordered in UC Medications - No data to display  Initial Impression / Assessment and Plan / UC Course  I have reviewed the triage vital signs and the nursing notes.  Pertinent labs & imaging results that were available during my care of the patient were reviewed by me and considered in my medical decision making (see chart for details).   36 year old male presents for 2-week history of cough and congestion.  Sputum is yellowish-green.  Reports sinus pain as well with the past couple days.  No relief with OTC medications.  Vitals are stable.  He is overall well-appearing.  He does have nasal congestion without drainage and mild posterior pharyngeal erythema with clear postnasal drainage.  His chest is clear to auscultation heart regular rate rhythm.  Suspected secondary bacterial sinusitis given his progression of symptoms.  Will treat with doxycycline as he has a hive-like reaction to penicillin.  Also sent Bromfed-DM and Atrovent nasal spray.  Reviewed return  precautions.   Final Clinical Impressions(s) / UC Diagnoses   Final diagnoses:  Acute sinusitis, recurrence not specified, unspecified location  Acute cough   Discharge Instructions   None    ED Prescriptions     Medication Sig Dispense Auth. Provider   doxycycline (VIBRAMYCIN) 100 MG capsule  Take 1 capsule (100 mg total) by mouth 2 (two) times daily for 7 days. 14 capsule Eusebio Friendly B, PA-C   brompheniramine-pseudoephedrine-DM 30-2-10 MG/5ML syrup Take 10 mLs by mouth 4 (four) times daily as needed for up to 7 days. 150 mL Eusebio Friendly B, PA-C   ipratropium (ATROVENT) 0.06 % nasal spray Place 2 sprays into both nostrils 4 (four) times daily. 15 mL Shirlee Latch, PA-C      PDMP not reviewed this encounter.   Shirlee Latch, PA-C 07/28/22 1511

## 2022-07-28 NOTE — ED Triage Notes (Signed)
Patient c/o cough and chest congestion that started 2 weeks ago.  Patient denies recent fevers.
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
## 2022-10-01 ENCOUNTER — Ambulatory Visit
Admission: EM | Admit: 2022-10-01 | Discharge: 2022-10-01 | Disposition: A | Discharge status: Home / Self Care | Payer: 59 | Attending: Emergency Medicine | Admitting: Emergency Medicine

## 2022-10-01 ENCOUNTER — Encounter: Payer: Self-pay | Admitting: Emergency Medicine

## 2022-10-01 DIAGNOSIS — J069 Acute upper respiratory infection, unspecified: Secondary | ICD-10-CM

## 2022-10-01 DIAGNOSIS — Z1152 Encounter for screening for COVID-19: Secondary | ICD-10-CM | POA: Diagnosis not present

## 2022-10-01 DIAGNOSIS — R059 Cough, unspecified: Secondary | ICD-10-CM | POA: Insufficient documentation

## 2022-10-01 LAB — SARS CORONAVIRUS 2 BY RT PCR: SARS Coronavirus 2 by RT PCR: NEGATIVE

## 2022-10-01 MED ORDER — IPRATROPIUM BROMIDE 0.06 % NA SOLN: 2.0000 | Freq: Four times a day (QID) | NASAL | 12 refills | Status: AC

## 2022-10-01 MED ORDER — PROMETHAZINE-DM 6.25-15 MG/5ML PO SYRP: 5.0000 mL | ORAL_SOLUTION | Freq: Four times a day (QID) | ORAL | 0 refills | Status: DC | PRN

## 2022-10-01 MED ORDER — BENZONATATE 100 MG PO CAPS: 200.0000 mg | ORAL_CAPSULE | Freq: Three times a day (TID) | ORAL | 0 refills | Status: DC

## 2022-10-01 NOTE — Discharge Instructions (Signed)
Your COVID test was negative.  Your symptoms are consistent with a viral respiratory infection.  Please use over-the-counter Tylenol and/or ibuprofen according to package instructions as needed for fever or pain.  Use the Atrovent nasal spray, 2 squirts in each nostril every 6 hours, as needed for runny nose and postnasal drip.  Use the Tessalon Perles every 8 hours during the day.  Take them with a small sip of water.  They may give you some numbness to the base of your tongue or a metallic taste in your mouth, this is normal.  Use the Promethazine DM cough syrup at bedtime for cough and congestion.  It will make you drowsy so do not take it during the day.  Return for reevaluation or see your primary care provider for any new or worsening symptoms.

## 2022-10-01 NOTE — ED Triage Notes (Signed)
Patient c/o runny nose, cough, nasal congestion, and headache that started 4 days ago.  Patient denies fevers.  Patient states that he took home covid test on Friday and was negative.

## 2022-10-01 NOTE — ED Provider Notes (Signed)
[facility]    CSN: [id number] Arrival date & time: 10/01/22  1328      History   Chief Complaint Chief Complaint  Patient presents with   Cough   Headache    HPI Jordan Everett is a 37 y.o. male.   HPI  37 year old male here for evaluation of respiratory complaints.  Patient reports that his symptoms began 4 days ago and they consist of headache with nasal congestion and yellow nasal discharge, sore throat, and a nonproductive cough.  He denies any fever, ear pain, shortness of breath, wheezing, or GI complaints.  Past Medical History:  Diagnosis Date   Acute pharyngitis    Acute sinusitis, unspecified    Anemia    Headache(784.0)    Murmur    Pneumonia    Rash and other nonspecific skin eruption     There are no problems to display for this patient.   Past Surgical History:  Procedure Laterality Date   WISDOM TOOTH EXTRACTION         Home Medications    Prior to Admission medications   Medication Sig Start Date End Date Taking? Authorizing Provider  benzonatate (TESSALON) 100 MG capsule Take 2 capsules (200 mg total) by mouth every 8 (eight) hours. 10/01/22  Yes Margarette Canada, NP  ipratropium (ATROVENT) 0.06 % nasal spray Place 2 sprays into both nostrils 4 (four) times daily. 10/01/22  Yes Margarette Canada, NP  promethazine-dextromethorphan (PROMETHAZINE-DM) 6.25-15 MG/5ML syrup Take 5 mLs by mouth 4 (four) times daily as needed. 10/01/22  Yes Margarette Canada, NP    Family History History reviewed. No pertinent family history.  Social History Social History   Tobacco Use   Smoking status: Never   Smokeless tobacco: Never  Vaping Use   Vaping Use: Never used  Substance Use Topics   Alcohol use: Yes    Comment: social   Drug use: No     Allergies   Penicillins   Review of Systems Review of Systems  Constitutional:  Negative for fever.  HENT:  Positive for congestion, rhinorrhea and sore throat. Negative for ear pain.   Respiratory:   Positive for cough. Negative for shortness of breath and wheezing.   Gastrointestinal:  Negative for diarrhea, nausea and vomiting.     Physical Exam Triage Vital Signs ED Triage Vitals  Enc Vitals Group     BP 10/01/22 1409 128/79     Pulse Rate 10/01/22 1409 82     Resp 10/01/22 1409 15     Temp 10/01/22 1409 98.4 F (36.9 C)     Temp Source 10/01/22 1409 Oral     SpO2 10/01/22 1409 97 %     Weight 10/01/22 1407 175 lb (79.4 kg)     Height 10/01/22 1407 '5\' 9"'$  (1.753 m)     Head Circumference --      Peak Flow --      Pain Score 10/01/22 1407 0     Pain Loc --      Pain Edu? --      Excl. in [place]? --    No data found.  Updated Vital Signs BP 128/79 (BP Location: Right Arm)   Pulse 82   Temp 98.4 F (36.9 C) (Oral)   Resp 15   Ht '5\' 9"'$  (1.753 m)   Wt 175 lb (79.4 kg)   SpO2 97%   BMI 25.84 kg/m   Visual Acuity Right Eye Distance:   Left Eye Distance:   Bilateral  Distance:    Right Eye Near:   Left Eye Near:    Bilateral Near:     Physical Exam Vitals and nursing note reviewed.  Constitutional:      Appearance: Normal appearance. He is not ill-appearing.  HENT:     Head: Normocephalic and atraumatic.     Right Ear: Tympanic membrane, ear canal and external ear normal. There is no impacted cerumen.     Left Ear: Tympanic membrane, ear canal and external ear normal. There is no impacted cerumen.     Nose: Congestion and rhinorrhea present.     Comments: His mucosa is erythematous and edematous with milky white discharge in both nares.    Mouth/Throat:     Mouth: Mucous membranes are moist.     Pharynx: Oropharynx is clear. Posterior oropharyngeal erythema present. No oropharyngeal exudate.     Comments: Mild erythema to the posterior oropharynx but no injection.  Nasal drip is present in the posterior pharynx. Cardiovascular:     Rate and Rhythm: Normal rate and regular rhythm.     Pulses: Normal pulses.     Heart sounds: Normal heart sounds. No murmur  heard.    No friction rub. No gallop.  Pulmonary:     Effort: Pulmonary effort is normal.     Breath sounds: Normal breath sounds. No wheezing, rhonchi or rales.  Musculoskeletal:     Cervical back: Normal range of motion and neck supple.  Lymphadenopathy:     Cervical: No cervical adenopathy.  Skin:    General: Skin is warm and dry.     Capillary Refill: Capillary refill takes less than 2 seconds.     Findings: No erythema or rash.  Neurological:     General: No focal deficit present.     Mental Status: He is alert and oriented to person, place, and time.  Psychiatric:        Mood and Affect: Mood normal.        Behavior: Behavior normal.        Thought Content: Thought content normal.        Judgment: Judgment normal.      UC Treatments / Results  Labs (all labs ordered are listed, but only abnormal results are displayed) Labs Reviewed  SARS CORONAVIRUS 2 BY RT PCR    EKG   Radiology No results found.  Procedures Procedures (including critical care time)  Medications Ordered in UC Medications - No data to display  Initial Impression / Assessment and Plan / UC Course  I have reviewed the triage vital signs and the nursing notes.  Pertinent labs & imaging results that were available during my care of the patient were reviewed by me and considered in my medical decision making (see chart for details).   Patient is a nontoxic-appearing 37 year old male who is not in any acute respiratory distress and has stable, normal vital signs presenting for evaluation of 4 days worth of respiratory symptoms as outlined HPI above.  His physical exam does reveal inflamed nasal mucosa with milky white discharge in both nares.  He also has some clear postnasal drip and mild posterior oropharyngeal erythema.  Cardiopulmonary exam bisque lung sounds in all fields.  No cervical lymphadenopathy present on exam.  The patient dam is consistent with a viral upper respiratory infection.  I do  not suspect flu as he has not had a fever.  I will order a COVID PCR to rule out the presence of COVID.  COVID PCR  is negative.  I will discharge patient with diagnosis of viral URI with a cough.  I will prescribe management nasal spray to help with the congestion and Tessalon Perles and Promethazine DM cough syrup to with cough and congestion.  Return precautions reviewed.   Final Clinical Impressions(s) / UC Diagnoses   Final diagnoses:  Viral URI with cough     Discharge Instructions      Your COVID test was negative.  Your symptoms are consistent with a viral respiratory infection.  Please use over-the-counter Tylenol and/or ibuprofen according to package instructions as needed for fever or pain.  Use the Atrovent nasal spray, 2 squirts in each nostril every 6 hours, as needed for runny nose and postnasal drip.  Use the Tessalon Perles every 8 hours during the day.  Take them with a small sip of water.  They may give you some numbness to the base of your tongue or a metallic taste in your mouth, this is normal.  Use the Promethazine DM cough syrup at bedtime for cough and congestion.  It will make you drowsy so do not take it during the day.  Return for reevaluation or see your primary care provider for any new or worsening symptoms.      ED Prescriptions     Medication Sig Dispense Auth. Provider   benzonatate (TESSALON) 100 MG capsule Take 2 capsules (200 mg total) by mouth every 8 (eight) hours. 21 capsule Margarette Canada, NP   ipratropium (ATROVENT) 0.06 % nasal spray Place 2 sprays into both nostrils 4 (four) times daily. 15 mL Margarette Canada, NP   promethazine-dextromethorphan (PROMETHAZINE-DM) 6.25-15 MG/5ML syrup Take 5 mLs by mouth 4 (four) times daily as needed. 118 mL Margarette Canada, NP      PDMP not reviewed this encounter.   Margarette Canada, NP 10/01/22 1454

## 2023-07-26 DIAGNOSIS — Z20822 Contact with and (suspected) exposure to covid-19: Secondary | ICD-10-CM | POA: Diagnosis not present

## 2023-07-26 DIAGNOSIS — R059 Cough, unspecified: Secondary | ICD-10-CM | POA: Diagnosis not present

## 2023-07-26 DIAGNOSIS — J069 Acute upper respiratory infection, unspecified: Secondary | ICD-10-CM | POA: Diagnosis not present

## 2023-08-27 ENCOUNTER — Ambulatory Visit (INDEPENDENT_AMBULATORY_CARE_PROVIDER_SITE_OTHER): Payer: 59

## 2023-08-27 ENCOUNTER — Ambulatory Visit
Admission: EM | Admit: 2023-08-27 | Discharge: 2023-08-27 | Disposition: A | Discharge status: Home / Self Care | Payer: 59 | Attending: Family Medicine | Admitting: Family Medicine

## 2023-08-27 DIAGNOSIS — J208 Acute bronchitis due to other specified organisms: Secondary | ICD-10-CM

## 2023-08-27 DIAGNOSIS — R051 Acute cough: Secondary | ICD-10-CM | POA: Diagnosis not present

## 2023-08-27 DIAGNOSIS — R0602 Shortness of breath: Secondary | ICD-10-CM | POA: Diagnosis not present

## 2023-08-27 DIAGNOSIS — R059 Cough, unspecified: Secondary | ICD-10-CM | POA: Diagnosis not present

## 2023-08-27 MED ORDER — ALBUTEROL SULFATE HFA 108 (90 BASE) MCG/ACT IN AERS: 2.0000 | INHALATION_SPRAY | RESPIRATORY_TRACT | 0 refills | Status: AC | PRN

## 2023-08-27 MED ORDER — PROMETHAZINE-DM 6.25-15 MG/5ML PO SYRP: 5.0000 mL | ORAL_SOLUTION | Freq: Four times a day (QID) | ORAL | 0 refills | Status: AC | PRN

## 2023-08-27 NOTE — ED Triage Notes (Signed)
Sx x 3 days  Productive cough clear mucus SOB  Headache started today No fever

## 2023-08-27 NOTE — ED Provider Notes (Signed)
[facility]    CSN: [id number] Arrival date & time: 08/27/23  1145      History   Chief Complaint Chief Complaint  Patient presents with   Cough   Shortness of Breath   Headache    HPI Jordan Everett is a 38 y.o. male.   HPI  History obtained from the patient. Jordan Everett presents for cough for the past 3 days. The cough has been getting "deeper."  Lately, has been having difficulty breathing with deep breathing. Feels somewhat shortness of breath but a little better today. Hears a "girgle" in his lung. Has headache.  No fever, rhinorrhea or nasal congestion. No known sick contacts.  Has been taking Elderberry, Dayquil, NyQuil and mucinex.   No history of asthma.  Denies smoking and vaping.      Past Medical History:  Diagnosis Date   Acute pharyngitis    Acute sinusitis, unspecified    Anemia    Headache(784.0)    Murmur    Pneumonia    Rash and other nonspecific skin eruption     There are no active problems to display for this patient.   Past Surgical History:  Procedure Laterality Date   WISDOM TOOTH EXTRACTION         Home Medications    Prior to Admission medications   Medication Sig Start Date End Date Taking? Authorizing Provider  albuterol (VENTOLIN HFA) 108 (90 Base) MCG/ACT inhaler Inhale 2 puffs into the lungs every 4 (four) hours as needed. 08/27/23  Yes Beryle Bagsby, DO  ipratropium (ATROVENT) 0.06 % nasal spray Place 2 sprays into both nostrils 4 (four) times daily. 10/01/22   Becky Augusta, NP  promethazine-dextromethorphan (PROMETHAZINE-DM) 6.25-15 MG/5ML syrup Take 5 mLs by mouth 4 (four) times daily as needed. 08/27/23   Katha Cabal, DO    Family History History reviewed. No pertinent family history.  Social History Social History   Tobacco Use   Smoking status: Never    Passive exposure: Never   Smokeless tobacco: Never  Vaping Use   Vaping status: Never Used  Substance Use Topics   Alcohol use: Yes    Comment:  social   Drug use: No     Allergies   Penicillins   Review of Systems Review of Systems: negative unless otherwise stated in HPI.      Physical Exam Triage Vital Signs ED Triage Vitals  Encounter Vitals Group     BP 08/27/23 1225 107/72     Systolic BP Percentile --      Diastolic BP Percentile --      Pulse Rate 08/27/23 1225 92     Resp 08/27/23 1225 19     Temp 08/27/23 1225 99.3 F (37.4 C)     Temp Source 08/27/23 1225 Oral     SpO2 08/27/23 1225 98 %     Weight --      Height --      Head Circumference --      Peak Flow --      Pain Score 08/27/23 1223 5     Pain Loc --      Pain Education --      Exclude from Growth Chart --    No data found.  Updated Vital Signs BP 107/72 (BP Location: Left Arm)   Pulse 92   Temp 99.3 F (37.4 C) (Oral)   Resp 19   SpO2 98%   Visual Acuity Right Eye Distance:   Left Eye Distance:  Bilateral Distance:    Right Eye Near:   Left Eye Near:    Bilateral Near:     Physical Exam GEN:     alert, non-toxic appearing male in no distress    HENT:  mucus membranes moist, no nasal discharge EYES:   pupils equal and reactive, no scleral injection or discharge RESP:  no increased work of breathing, clear to auscultation bilaterally CVS:   regular rate and rhythm Skin:   warm and dry, no rash on visible skin    UC Treatments / Results  Labs (all labs ordered are listed, but only abnormal results are displayed) Labs Reviewed - No data to display  EKG   Radiology DG Chest 2 View Result Date: 08/27/2023 CLINICAL DATA:  cough and SOB EXAM: CHEST - 2 VIEW COMPARISON:  03/01/2017 FINDINGS: The heart size and mediastinal contours are within normal limits. Both lungs are clear. The visualized skeletal structures are unremarkable. IMPRESSION: No active cardiopulmonary disease. Electronically Signed   By: Judie Petit.  Shick M.D.   On: 08/27/2023 14:05    Procedures Procedures (including critical care time)  Medications Ordered  in UC Medications - No data to display  Initial Impression / Assessment and Plan / UC Course  I have reviewed the triage vital signs and the nursing notes.  Pertinent labs & imaging results that were available during my care of the patient were reviewed by me and considered in my medical decision making (see chart for details).       Pt is a 38 y.o. male who presents for 3 days of respiratory symptoms. Jordan Everett is afebrile here without recent antipyretics. Satting well on room air. Overall pt is non-toxic appearing, well hydrated, without respiratory distress. Pulmonary exam is unremarkable.  He request a chest x-ray to evaluate for pneumonia.  Given his shortness of breath this is a reasonable request.  COVID and influenza testing deferred as it would not change management.  Patient has been afebrile this entire time..  Chest xray personally reviewed by me without focal pneumonia, pleural effusion, cardiomegaly or pneumothorax.  Radiologist impression reviewed.  Suspect viral illness.  Albuterol for bronchospasm and shortness of breath.  Promethazine DM for cough.  Discussed symptomatic treatment.  Explained lack of efficacy of antibiotics in viral disease.  Typical duration of symptoms discussed.   Return and ED precautions given and voiced understanding. Discussed MDM, treatment plan and plan for follow-up with patient who agrees with plan.     Final Clinical Impressions(s) / UC Diagnoses   Final diagnoses:  Acute cough  Acute viral bronchitis     Discharge Instructions      Your chest x-ray did not show any pneumonia.  I suspect that you have a viral bronchitis that will gradually get better with time.  Note that the cough with a virus can last up to 3 weeks.  Stop by the pharmacy to pick up your cough medications.     ED Prescriptions     Medication Sig Dispense Auth. Provider   promethazine-dextromethorphan (PROMETHAZINE-DM) 6.25-15 MG/5ML syrup Take 5 mLs by mouth 4 (four)  times daily as needed. 118 mL Pinchas Reither, DO   albuterol (VENTOLIN HFA) 108 (90 Base) MCG/ACT inhaler Inhale 2 puffs into the lungs every 4 (four) hours as needed. 6.7 g Katha Cabal, DO      PDMP not reviewed this encounter.   Katha Cabal, DO 08/27/23 1736

## 2023-08-27 NOTE — Discharge Instructions (Signed)
Your chest x-ray did not show any pneumonia.  I suspect that you have a viral bronchitis that will gradually get better with time.  Note that the cough with a virus can last up to 3 weeks.  Stop by the pharmacy to pick up your cough medications.

## 2023-12-30 DIAGNOSIS — H05231 Hemorrhage of right orbit: Secondary | ICD-10-CM | POA: Diagnosis not present

## 2023-12-30 DIAGNOSIS — H11421 Conjunctival edema, right eye: Secondary | ICD-10-CM | POA: Diagnosis not present

## 2023-12-30 DIAGNOSIS — S0011XA Contusion of right eyelid and periocular area, initial encounter: Secondary | ICD-10-CM | POA: Diagnosis not present

## 2023-12-30 DIAGNOSIS — S0993XA Unspecified injury of face, initial encounter: Secondary | ICD-10-CM | POA: Diagnosis not present

## 2023-12-30 DIAGNOSIS — S0990XA Unspecified injury of head, initial encounter: Secondary | ICD-10-CM | POA: Diagnosis not present

## 2023-12-30 DIAGNOSIS — W01198A Fall on same level from slipping, tripping and stumbling with subsequent striking against other object, initial encounter: Secondary | ICD-10-CM | POA: Diagnosis not present

## 2023-12-30 DIAGNOSIS — H1131 Conjunctival hemorrhage, right eye: Secondary | ICD-10-CM | POA: Diagnosis not present

## 2023-12-30 DIAGNOSIS — Y92481 Parking lot as the place of occurrence of the external cause: Secondary | ICD-10-CM | POA: Diagnosis not present

## 2023-12-30 DIAGNOSIS — W19XXXA Unspecified fall, initial encounter: Secondary | ICD-10-CM | POA: Diagnosis not present

## 2023-12-30 DIAGNOSIS — W010XXA Fall on same level from slipping, tripping and stumbling without subsequent striking against object, initial encounter: Secondary | ICD-10-CM | POA: Diagnosis not present

## 2024-01-01 DIAGNOSIS — S0511XA Contusion of eyeball and orbital tissues, right eye, initial encounter: Secondary | ICD-10-CM | POA: Diagnosis not present

## 2024-01-01 DIAGNOSIS — H1131 Conjunctival hemorrhage, right eye: Secondary | ICD-10-CM | POA: Diagnosis not present

## 2024-01-21 DIAGNOSIS — S0511XA Contusion of eyeball and orbital tissues, right eye, initial encounter: Secondary | ICD-10-CM | POA: Diagnosis not present

## 2024-02-14 DIAGNOSIS — Z113 Encounter for screening for infections with a predominantly sexual mode of transmission: Secondary | ICD-10-CM | POA: Diagnosis not present

## 2024-02-25 ENCOUNTER — Encounter (INDEPENDENT_AMBULATORY_CARE_PROVIDER_SITE_OTHER): Payer: Self-pay

## 2024-04-10 DIAGNOSIS — S0511XA Contusion of eyeball and orbital tissues, right eye, initial encounter: Secondary | ICD-10-CM | POA: Diagnosis not present
